# Patient Record
Sex: Female | Born: 1965 | Hispanic: Yes | Marital: Married | Smoking: Never smoker
Health system: Southern US, Community
[De-identification: ages and names within clinical notes are randomized; demographics above are authoritative.]

## PROBLEM LIST (undated history)

## (undated) DIAGNOSIS — E079 Disorder of thyroid, unspecified: Secondary | ICD-10-CM

## (undated) DIAGNOSIS — D219 Benign neoplasm of connective and other soft tissue, unspecified: Secondary | ICD-10-CM

## (undated) DIAGNOSIS — K219 Gastro-esophageal reflux disease without esophagitis: Secondary | ICD-10-CM

## (undated) HISTORY — DX: Disorder of thyroid, unspecified: E07.9

## (undated) HISTORY — DX: Gastro-esophageal reflux disease without esophagitis: K21.9

## (undated) HISTORY — DX: Benign neoplasm of connective and other soft tissue, unspecified: D21.9

---

## 2010-04-08 HISTORY — PX: NASAL POLYP EXCISION: SHX2068

## 2013-04-08 HISTORY — PX: HYSTEROSCOPY: SHX211

## 2015-06-26 ENCOUNTER — Encounter: Payer: Self-pay | Admitting: Obstetrics & Gynecology

## 2017-09-11 MED FILL — ARMOUR THYROID 15 MG TABLET: 15 | 30 days supply | Qty: 30 | Fill #0

## 2017-09-11 MED FILL — ARMOUR THYROID 60 MG TABLET: 60 | 30 days supply | Qty: 30 | Fill #0

## 2017-09-23 ENCOUNTER — Other Ambulatory Visit: Payer: Self-pay | Admitting: Internal Medicine

## 2017-09-23 DIAGNOSIS — Z1231 Encounter for screening mammogram for malignant neoplasm of breast: Secondary | ICD-10-CM

## 2017-11-11 MED FILL — ARMOUR THYROID 15 MG TABLET: 15 | 30 days supply | Qty: 30 | Fill #1

## 2017-11-11 MED FILL — ARMOUR THYROID 60 MG TABLET: 60 | 30 days supply | Qty: 30 | Fill #1

## 2017-12-03 MED FILL — PHENTERMINE 37.5 MG TABLET: 37.5 | 30 days supply | Qty: 30 | Fill #0

## 2017-12-25 MED FILL — ARMOUR THYROID 15 MG TABLET: 15 | 90 days supply | Qty: 90 | Fill #0

## 2017-12-25 MED FILL — ARMOUR THYROID 60 MG TABLET: 60 | 90 days supply | Qty: 90 | Fill #0

## 2017-12-27 ENCOUNTER — Encounter: Payer: Self-pay | Admitting: Internal Medicine

## 2017-12-27 DIAGNOSIS — E038 Other specified hypothyroidism: Secondary | ICD-10-CM

## 2017-12-27 DIAGNOSIS — E039 Hypothyroidism, unspecified: Secondary | ICD-10-CM | POA: Insufficient documentation

## 2017-12-27 DIAGNOSIS — E063 Autoimmune thyroiditis: Principal | ICD-10-CM

## 2018-01-01 ENCOUNTER — Other Ambulatory Visit: Payer: Self-pay | Admitting: Internal Medicine

## 2018-01-01 DIAGNOSIS — R1011 Right upper quadrant pain: Secondary | ICD-10-CM

## 2018-01-02 ENCOUNTER — Telehealth: Payer: Self-pay | Admitting: Obstetrics & Gynecology

## 2018-01-02 NOTE — Telephone Encounter (Signed)
Called and left a message for patient to call back to schedule a new patient doctor referral appointment with our office to see Dr. Sabra Heck for: Perimenopause disorder, uterine fibroids, and pap smear.

## 2018-01-12 ENCOUNTER — Ambulatory Visit
Admission: RE | Admit: 2018-01-12 | Discharge: 2018-01-12 | Disposition: A | Discharge status: Home / Self Care | Payer: No Typology Code available for payment source | Source: Ambulatory Visit | Attending: Internal Medicine | Admitting: Internal Medicine

## 2018-01-12 DIAGNOSIS — R1011 Right upper quadrant pain: Secondary | ICD-10-CM

## 2018-01-12 NOTE — Progress Notes (Signed)
Ultrasound is significant for two small GB polyps. I am not sure how this compares to previous u/s results. Please bring in old studies to scan into your chart. Have you heard about GI appt yet?

## 2018-01-13 ENCOUNTER — Other Ambulatory Visit: Payer: Self-pay | Admitting: Internal Medicine

## 2018-01-13 MED ORDER — PHENTERMINE HCL 37.5 MG PO CAPS: 37.5000 mg | ORAL_CAPSULE | ORAL | 0 refills | Status: DC

## 2018-01-13 MED FILL — PHENTERMINE 37.5 MG TABLET: 37.5 | 30 days supply | Qty: 30 | Fill #0

## 2018-01-21 ENCOUNTER — Encounter: Payer: Self-pay | Admitting: Internal Medicine

## 2018-01-21 ENCOUNTER — Ambulatory Visit (INDEPENDENT_AMBULATORY_CARE_PROVIDER_SITE_OTHER): Payer: No Typology Code available for payment source | Admitting: Internal Medicine

## 2018-01-21 VITALS — BP 116/78 | HR 91 | Temp 98.1°F | Ht 63.0 in | Wt 157.0 lb

## 2018-01-21 DIAGNOSIS — Z6827 Body mass index (BMI) 27.0-27.9, adult: Secondary | ICD-10-CM | POA: Diagnosis not present

## 2018-01-21 DIAGNOSIS — N951 Menopausal and female climacteric states: Secondary | ICD-10-CM

## 2018-01-21 DIAGNOSIS — E663 Overweight: Secondary | ICD-10-CM | POA: Diagnosis not present

## 2018-01-21 DIAGNOSIS — E039 Hypothyroidism, unspecified: Secondary | ICD-10-CM

## 2018-02-02 ENCOUNTER — Other Ambulatory Visit (HOSPITAL_COMMUNITY)
Admission: RE | Admit: 2018-02-02 | Discharge: 2018-02-02 | Disposition: A | Discharge status: Home / Self Care | Payer: No Typology Code available for payment source | Source: Ambulatory Visit | Attending: Obstetrics & Gynecology | Admitting: Obstetrics & Gynecology

## 2018-02-02 ENCOUNTER — Encounter: Payer: Self-pay | Admitting: Obstetrics & Gynecology

## 2018-02-02 ENCOUNTER — Ambulatory Visit (INDEPENDENT_AMBULATORY_CARE_PROVIDER_SITE_OTHER): Payer: No Typology Code available for payment source | Admitting: Obstetrics & Gynecology

## 2018-02-02 ENCOUNTER — Other Ambulatory Visit: Payer: Self-pay

## 2018-02-02 VITALS — BP 96/62 | HR 92 | Resp 16 | Ht 63.0 in | Wt 152.0 lb

## 2018-02-02 DIAGNOSIS — Z01419 Encounter for gynecological examination (general) (routine) without abnormal findings: Secondary | ICD-10-CM | POA: Diagnosis not present

## 2018-02-02 DIAGNOSIS — Z124 Encounter for screening for malignant neoplasm of cervix: Secondary | ICD-10-CM | POA: Diagnosis not present

## 2018-02-02 DIAGNOSIS — D251 Intramural leiomyoma of uterus: Secondary | ICD-10-CM

## 2018-02-02 DIAGNOSIS — D259 Leiomyoma of uterus, unspecified: Secondary | ICD-10-CM | POA: Insufficient documentation

## 2018-02-02 NOTE — Progress Notes (Signed)
Patient scheduled while in office for PUS on 02/19/18 at 12:30pm, consult at 1pm with Dr. Sabra Heck. Patient declined earlier appt offered. Patient is aware she will be called with benefits prior to appt. Patient verbalizes understanding and is agreeable.

## 2018-02-02 NOTE — Progress Notes (Signed)
52 y.o. G57P2002 Married Other or two or more races female here for new patient annual exam.  She was referred from Dr. Glendale Chard.  Reports cycles started to change about a year ago.  She initially skipped 1 months but has skipped up to 90 days.  Her cycles typically lasts about 5-7 days.  She will have three days.  She reports 3 days are heavy.  She uses pads and tampons on the heavy days and she has to change about every 3 hours.  She does sometimes have clots.  She does not have much cramping.  Has experienced significant night sweats over the past.  Did use an OTC tea (with black cohosh) and has used CBD oil.  Typically does not have hot flashes during.    Pap smear is due today.    H/o uterine fibroids.  Last PUS was about 2 years.  7cm fibroid noted then.  She has four other fibroids noted at that time.    Patient's last menstrual period was 12/07/2017 (approximate).          Sexually active: Yes.    The current method of family planning is vasectomy.    Exercising: Yes.    zumba Smoker:  no  Health Maintenance: Pap:  ~3 years ago Normal  History of abnormal Pap:  no MMG:  Age 52 - Normal  Colonoscopy:  2017 Normal. F/u 5 years. Has GI appt 02/05/18. BMD:   Never TDaP:  2017 Pneumonia vaccine(s):  No Shingrix:   No Hep C testing: n/a Screening Labs: PCP   reports that she has never smoked. She has never used smokeless tobacco. She reports that she drinks alcohol. She reports that she has current or past drug history.  Past Medical History:  Diagnosis Date  . Fibroid   . GERD (gastroesophageal reflux disease)   . Thyroid disease     Past Surgical History:  Procedure Laterality Date  . HYSTEROSCOPY  2015    Current Outpatient Medications  Medication Sig Dispense Refill  . Multiple Vitamin (MULTIVITAMIN) tablet Take 1 tablet by mouth daily.    . phentermine 37.5 MG capsule Take 1 capsule (37.5 mg total) by mouth every morning. 30 capsule 0  . thyroid (ARMOUR) 15 MG  tablet Take 15 mg by mouth daily.    Marland Kitchen thyroid (ARMOUR) 60 MG tablet Take 60 mg by mouth daily before breakfast.    . UNABLE TO FIND Take 5 drops by mouth at bedtime. CBD No THC Oil     No current facility-administered medications for this visit.     Family History  Problem Relation Age of Onset  . Colon cancer Mother   . Acromegaly Mother   . Diabetes Father   . Hypertension Father   . Alcohol abuse Father   . Hyperlipidemia Father     Review of Systems  All other systems reviewed and are negative.   Exam:   BP 96/62 (BP Location: Right Arm, Patient Position: Sitting, Cuff Size: Large)   Pulse 92   Resp 16   Ht 5\' 3"  (1.6 m)   Wt 152 lb (68.9 kg)   LMP 12/07/2017 (Approximate)   BMI 26.93 kg/m    Height: 5\' 3"  (160 cm)  Ht Readings from Last 3 Encounters:  02/02/18 5\' 3"  (1.6 m)  01/21/18 5\' 3"  (1.6 m)  12/27/17 5\' 3"  (1.6 m)    General appearance: alert, cooperative and appears stated age Head: Normocephalic, without obvious abnormality, atraumatic Neck: no  adenopathy, supple, symmetrical, trachea midline and thyroid normal to inspection and palpation Lungs: clear to auscultation bilaterally Breasts: normal appearance, no masses or tenderness Heart: regular rate and rhythm Abdomen: soft, non-tender; bowel sounds normal; no masses,  no organomegaly Extremities: extremities normal, atraumatic, no cyanosis or edema Skin: Skin color, texture, turgor normal. No rashes or lesions Lymph nodes: Cervical, supraclavicular, and axillary nodes normal. No abnormal inguinal nodes palpated Neurologic: Grossly normal   Pelvic: External genitalia:  no lesions              Urethra:  normal appearing urethra with no masses, tenderness or lesions              Bartholins and Skenes: normal                 Vagina: normal appearing vagina with normal color and discharge, no lesions              Cervix: no lesions              Pap taken: Yes.   Bimanual Exam:  Uterus:  Enlarged to  10-12 weeks with 5cm feeling nodule on the right.  Enlarged anteriorly as well              Adnexa: normal adnexa and no mass, fullness, tenderness               Rectovaginal: Confirms               Anus:  normal sphincter tone, no lesions  Chaperone was present for exam.  A:  Well Woman with normal exam H/o uterine fibroids and menorrhagia Hypothyroidism Family hx of colon cancer in her mother Perimenopausal by symptoms and cycle changes.  OTC options discussed.    P:   Mammogram guidelines reviewed pap smear with HR HPV obtained today Return for PUS.  Pt not sure she wants any treatment other than taking iron to prevent anemia.  Recommended having iron studies done with next blood work with Dr. Baird Cancer. return annually or prn

## 2018-02-03 ENCOUNTER — Encounter: Payer: Self-pay | Admitting: Obstetrics & Gynecology

## 2018-02-03 NOTE — Progress Notes (Signed)
Hello!  I certainly will! I will forward the results to you once results are available ( I think she uses [facility]).   Thanks,   [employer]

## 2018-02-04 LAB — CYTOLOGY - PAP
Diagnosis: NEGATIVE
HPV: NOT DETECTED

## 2018-02-13 ENCOUNTER — Ambulatory Visit: Payer: No Typology Code available for payment source | Admitting: Obstetrics & Gynecology

## 2018-02-13 ENCOUNTER — Telehealth: Payer: Self-pay | Admitting: Obstetrics & Gynecology

## 2018-02-13 ENCOUNTER — Encounter

## 2018-02-13 NOTE — Telephone Encounter (Signed)
Patient canceled her upcoming PUS and would like to reschedule.

## 2018-02-16 NOTE — Telephone Encounter (Signed)
Spoke with patient in regards to rescheduling an ultrasound appointment. Patient requested to reschedule on 03/26/18 due to work schedule. Patient declined earlier appointment dates. Patient has re-scheduled on 03/26/18 with Dr Sabra Heck. Patient is aware of the appointment date, arrival time and cancellation policy.  Forwarding to Dr Sabra Heck for final review. Patient is agreeable to disposition. Will close encounter

## 2018-02-18 MED FILL — OMEPRAZOLE 40 MG CPDR: 40 | 90 days supply | Qty: 90 | Fill #0

## 2018-02-19 ENCOUNTER — Other Ambulatory Visit: Payer: Self-pay

## 2018-02-19 ENCOUNTER — Other Ambulatory Visit: Payer: Self-pay | Admitting: Obstetrics & Gynecology

## 2018-02-22 NOTE — Progress Notes (Addendum)
Subjective:     Patient ID: Adrienne Joyce , female    DOB: 08-26-1965 , 52 y.o.   MRN: [id number]   Chief Complaint  Patient presents with  . Menopause  . Obesity    HPI  SHE IS HERE TODAY FOR F/U BHRT.  OUR INITIAL GOAL WAS TO ADDRESS THYROID ISSUE PRIOR TO STARTING HRT THERAPY. SHE IS READY TO START HRT. SHE HAS BEEN SEEN BY GYN SINCE HER LAST VISIT.   SECONDLY, SHE HAS BEEN TAKING PHENTERMINE TO HELP HER ACHIEVE IDEAL BODY WEIGHT. SHE HAS NOT HAD ANY ISSUES WITH THE MEDICATION. SHE HAS NOTICED THAT IT DOES SUPPRESS HER APPETITE.     Past Medical History:  Diagnosis Date  . Fibroid   . GERD (gastroesophageal reflux disease)   . Thyroid disease      Family History  Problem Relation Age of Onset  . Colon cancer Mother 86       carcinoid tumor  . Acromegaly Mother   . Diabetes Father   . Hypertension Father   . Alcohol abuse Father   . Hyperlipidemia Father      Current Outpatient Medications:  .  phentermine 37.5 MG capsule, Take 1 capsule (37.5 mg total) by mouth every morning., Disp: 30 capsule, Rfl: 0 .  thyroid (ARMOUR) 15 MG tablet, Take 15 mg by mouth daily., Disp: , Rfl:  .  thyroid (ARMOUR) 60 MG tablet, Take 60 mg by mouth daily before breakfast., Disp: , Rfl:  .  UNABLE TO FIND, Take 5 drops by mouth at bedtime. CBD No THC Oil, Disp: , Rfl:  .  Multiple Vitamin (MULTIVITAMIN) tablet, Take 1 tablet by mouth daily., Disp: , Rfl:    Allergies  Allergen Reactions  . Morphine And Related Shortness Of Breath  . Macrobid [Nitrofurantoin]      Review of Systems  Constitutional: Negative.   Respiratory: Negative.   Cardiovascular: Negative.   Gastrointestinal: Negative.   Neurological: Negative.   Psychiatric/Behavioral: Negative.      Today's Vitals   01/21/18 1446  BP: 116/78  Pulse: 91  Temp: 98.1 F (36.7 C)  TempSrc: Oral  SpO2: 98%  Weight: 157 lb (71.2 kg)  Height: 5\' 3"  (1.6 m)   Body mass index is 27.81 kg/m.   Objective:   Physical Exam  Constitutional: She is oriented to person, place, and time. She appears well-developed and well-nourished.  HENT:  Head: Normocephalic and atraumatic.  Eyes: EOM are normal.  Cardiovascular: Normal rate, regular rhythm and normal heart sounds.  Pulmonary/Chest: Effort normal.  Neurological: She is alert and oriented to person, place, and time.  Psychiatric: She has a normal mood and affect.  Nursing note and vitals reviewed.       Assessment And Plan:     1. Perimenopausal  I WILL START HER ON PROGESTERONE 50MG  NIGHTLY EXCEPT SUNDAYS. SHE HAS DISCUSSED USE OF BHRT WITH HER GYN. SHE WILL RTO IN SIX WEEKS FOR RE-EVALUATION.   2. Primary hypothyroidism  I WILL CHECK A THYROID PANEL AND ADJUST MEDS AS NEEDED. SHE HAS REQUESTED TO HAVE HER LABS SENT TO [facility].   - TSH; Future - T4, Free; Future - T4, Free - TSH  3. Body mass index (bmi) 27.0-27.9, adult  SHE IS CONGRATULATED ON HER WEIGHT LOSS THUS FAR. SHE WAS GIVEN ANOTHER REFILL OF PHENTERMINE. SHE WILL RTO IN 8 WEEKS FOR RE-EVALUATION.    4. Overweight  SHE IS ENCOURAGED TO CONTINUE WITH HER WEIGHT LOSS JOURNEY. SHE IS  CLOSE TO HER GOAL WEIGHT.   Maximino Greenland, MD

## 2018-03-18 ENCOUNTER — Ambulatory Visit
Admission: RE | Admit: 2018-03-18 | Discharge: 2018-03-18 | Disposition: A | Discharge status: Home / Self Care | Payer: No Typology Code available for payment source | Source: Ambulatory Visit | Attending: Internal Medicine | Admitting: Internal Medicine

## 2018-03-18 DIAGNOSIS — Z1231 Encounter for screening mammogram for malignant neoplasm of breast: Secondary | ICD-10-CM

## 2018-03-20 ENCOUNTER — Other Ambulatory Visit: Payer: Self-pay | Admitting: Internal Medicine

## 2018-03-20 DIAGNOSIS — R928 Other abnormal and inconclusive findings on diagnostic imaging of breast: Secondary | ICD-10-CM

## 2018-03-23 ENCOUNTER — Ambulatory Visit
Admission: RE | Admit: 2018-03-23 | Discharge: 2018-03-23 | Disposition: A | Discharge status: Home / Self Care | Payer: No Typology Code available for payment source | Source: Ambulatory Visit | Attending: Internal Medicine | Admitting: Internal Medicine

## 2018-03-23 ENCOUNTER — Other Ambulatory Visit: Payer: Self-pay | Admitting: Internal Medicine

## 2018-03-23 DIAGNOSIS — R928 Other abnormal and inconclusive findings on diagnostic imaging of breast: Secondary | ICD-10-CM

## 2018-03-23 DIAGNOSIS — N6310 Unspecified lump in the right breast, unspecified quadrant: Secondary | ICD-10-CM

## 2018-03-23 DIAGNOSIS — N6320 Unspecified lump in the left breast, unspecified quadrant: Secondary | ICD-10-CM

## 2018-03-24 ENCOUNTER — Other Ambulatory Visit: Payer: Self-pay | Admitting: Internal Medicine

## 2018-03-24 MED ORDER — PHENTERMINE HCL 37.5 MG PO CAPS: 37.5000 mg | ORAL_CAPSULE | ORAL | 0 refills | Status: DC

## 2018-03-26 ENCOUNTER — Ambulatory Visit: Payer: No Typology Code available for payment source | Admitting: Obstetrics & Gynecology

## 2018-03-26 ENCOUNTER — Encounter: Payer: Self-pay | Admitting: Obstetrics & Gynecology

## 2018-03-26 ENCOUNTER — Ambulatory Visit (INDEPENDENT_AMBULATORY_CARE_PROVIDER_SITE_OTHER): Payer: No Typology Code available for payment source

## 2018-03-26 VITALS — BP 108/60 | HR 80 | Resp 16 | Ht 63.0 in | Wt 149.0 lb

## 2018-03-26 DIAGNOSIS — D251 Intramural leiomyoma of uterus: Secondary | ICD-10-CM

## 2018-03-26 NOTE — Progress Notes (Signed)
52 y.o. G6P2002 Married female here for pelvic ultrasound due to enlarged uterus on physical exam.  Pt has been told in the past that she has fibroids.  Thinks the largest was > 5cm.  I do not have documentation of this.  Cycles are less frequent.  Flow is still heavy but feels manageable for her.  She is not interested in any treatment if at all possible.  Patient's last menstrual period was 12/07/2017.  Contraception: vsectomy  Findings:  UTERUS: 8.1 x 5.5 x 4.0cm EMS: 5.100mm ADNEXA: Left ovary:  1.7 x 1.1 x 1.0cm with fibroids 1.7 x 0.8cm, 4.5 x 3.6cm, 1.0 x 1.0cm, and 1.5 x 1.8cm fibroids.  Largest fibroid is right fundal       Right ovary: 2.3 x 1.2 x 2.1cm CUL DE SAC:  No free fluid  Discussion:  Findings reviewed with pt.  She feels the largest fibroid is smaller than in comparison to previous imaging.  Is very satisfied with this.  As above, declines any treatment unless absolutely necessary.  Will continue to follow with physical exam.  She is comfortable with this.  Assessment:  Fibroid uterus  Plan:  Return for AEX in one year.  Will repeat PUS if changes on physical exam or with symptoms.  Questions answered.    ~15 minutes spent with patient >50% of time was in face to face discussion of above.

## 2018-03-27 ENCOUNTER — Ambulatory Visit
Admission: RE | Admit: 2018-03-27 | Discharge: 2018-03-27 | Disposition: A | Discharge status: Home / Self Care | Payer: No Typology Code available for payment source | Source: Ambulatory Visit | Attending: Internal Medicine | Admitting: Internal Medicine

## 2018-03-27 DIAGNOSIS — N6320 Unspecified lump in the left breast, unspecified quadrant: Secondary | ICD-10-CM

## 2018-03-27 DIAGNOSIS — N6310 Unspecified lump in the right breast, unspecified quadrant: Secondary | ICD-10-CM

## 2018-04-07 MED FILL — PHENTERMINE 37.5 MG TABLET: 37.5 | 30 days supply | Qty: 30 | Fill #0

## 2018-04-16 MED FILL — FLOVENT HFA 110 MCG INHALER: 110 | 30 days supply | Qty: 12 | Fill #0

## 2018-04-17 MED FILL — GAVILYTE-G SOLUTION: 236 | 1 days supply | Qty: 4000 | Fill #0

## 2018-04-21 ENCOUNTER — Other Ambulatory Visit: Payer: Self-pay | Admitting: Internal Medicine

## 2018-04-21 MED FILL — ARMOUR THYROID 15 MG TABLET: 15 | 90 days supply | Qty: 90 | Fill #0

## 2018-04-21 MED FILL — ARMOUR THYROID 60 MG TABLET: 60 | 90 days supply | Qty: 90 | Fill #0

## 2018-06-16 ENCOUNTER — Encounter: Payer: Self-pay | Admitting: Internal Medicine

## 2018-07-01 ENCOUNTER — Other Ambulatory Visit: Payer: Self-pay

## 2018-07-01 ENCOUNTER — Other Ambulatory Visit: Payer: Self-pay | Admitting: Internal Medicine

## 2018-07-01 DIAGNOSIS — E663 Overweight: Secondary | ICD-10-CM

## 2018-07-01 DIAGNOSIS — Z6827 Body mass index (BMI) 27.0-27.9, adult: Secondary | ICD-10-CM

## 2018-07-01 MED ORDER — PHENTERMINE HCL 37.5 MG PO CAPS: 37.5000 mg | ORAL_CAPSULE | ORAL | 0 refills | Status: DC

## 2018-08-17 ENCOUNTER — Other Ambulatory Visit: Payer: Self-pay | Admitting: Internal Medicine

## 2018-08-17 DIAGNOSIS — Z6827 Body mass index (BMI) 27.0-27.9, adult: Secondary | ICD-10-CM

## 2018-08-17 DIAGNOSIS — E663 Overweight: Secondary | ICD-10-CM

## 2018-08-17 MED ORDER — PHENTERMINE HCL 37.5 MG PO CAPS: 37.5000 mg | ORAL_CAPSULE | ORAL | 0 refills | Status: DC

## 2018-08-18 ENCOUNTER — Other Ambulatory Visit: Payer: Self-pay | Admitting: Internal Medicine

## 2018-10-21 ENCOUNTER — Encounter: Payer: No Typology Code available for payment source | Admitting: Internal Medicine

## 2018-10-29 ENCOUNTER — Other Ambulatory Visit: Payer: Self-pay

## 2018-10-29 ENCOUNTER — Encounter: Payer: Self-pay | Admitting: Emergency Medicine

## 2018-10-29 ENCOUNTER — Ambulatory Visit
Admission: EM | Admit: 2018-10-29 | Discharge: 2018-10-29 | Disposition: A | Discharge status: Home / Self Care | Payer: No Typology Code available for payment source | Attending: Family Medicine | Admitting: Family Medicine

## 2018-10-29 DIAGNOSIS — W57XXXA Bitten or stung by nonvenomous insect and other nonvenomous arthropods, initial encounter: Secondary | ICD-10-CM

## 2018-10-29 DIAGNOSIS — L03115 Cellulitis of right lower limb: Secondary | ICD-10-CM | POA: Diagnosis not present

## 2018-10-29 MED ORDER — DOXYCYCLINE HYCLATE 100 MG PO CAPS: 100.0000 mg | ORAL_CAPSULE | Freq: Two times a day (BID) | ORAL | 0 refills | Status: DC

## 2018-10-29 MED ORDER — PREDNISONE 10 MG (21) PO TBPK: ORAL_TABLET | ORAL | 0 refills | Status: DC

## 2018-10-29 MED FILL — DOXYCYCLINE HYC 100 MG CAPS: 100 | 7 days supply | Qty: 14 | Fill #0

## 2018-10-29 MED FILL — predniSONE 10 MG TABS: 10 | 6 days supply | Qty: 21 | Fill #0

## 2018-10-29 NOTE — Discharge Instructions (Signed)
Rest.  Elevate.  Medications as prescribed.  Take care  Dr. Lacinda Axon

## 2018-10-29 NOTE — ED Triage Notes (Signed)
Pt c/o right calf pain x3 days, reports sharp pain while mowing lawn 2 nights ago. Redness, swelling and pain have increased. Reports getting 600mg  clindamycin shot last night, but continued pain.

## 2018-10-29 NOTE — ED Provider Notes (Signed)
[facility]    CSN: [id number] Arrival date & time: 10/29/18  0626  History   Chief Complaint Chief Complaint  Patient presents with  . Leg Pain   HPI  53 year old female presents with right leg/calf pain.  Patient reports that approximately 3 days ago she was mowing grass and felt a sudden pinch of pain.  She believes that she was bitten or stung by an insect.  Since that time she has had increasing pain, redness, and warmth to this area.  It is located on her right calf.  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  She was given an injection by her child who is also a [employer] scientist.  Area seems to have improved.  However, it is still red, swollen, and slightly painful.  No fever.  No known exacerbating factors.  No other associated symptoms.  No other complaints.  PMH, Surgical Hx, Family Hx, Social History reviewed and updated as below.  Past Medical History:  Diagnosis Date  . Fibroid   . GERD (gastroesophageal reflux disease)   . Thyroid disease    Patient Active Problem List   Diagnosis Date Noted  . Fibroid uterus 02/02/2018  . Hypothyroidism 12/27/2017   Past Surgical History:  Procedure Laterality Date  . HYSTEROSCOPY  2015  . NASAL POLYP EXCISION  2012   with septoplasty   OB History    Gravida  2   Para  2   Term  2   Preterm      AB      Living  2     SAB      TAB      Ectopic      Multiple      Live Births  2          Home Medications    Prior to Admission medications   Medication Sig Start Date End Date Taking? Authorizing Provider  ARMOUR THYROID 15 MG tablet TAKE 1 TABLET BY MOUTH DAILY 04/21/18  Yes Glendale Chard, MD  ARMOUR THYROID 60 MG tablet TAKE 1 TABLET BY MOUTH DAILY 04/21/18  Yes Glendale Chard, MD  Multiple Vitamin (MULTIVITAMIN) tablet Take 1 tablet by mouth daily.   Yes [provider]  omeprazole (PRILOSEC) 40 MG capsule daily. 02/25/18  Yes [provider]  doxycycline (VIBRAMYCIN) 100  MG capsule Take 1 capsule (100 mg total) by mouth 2 (two) times daily. 10/29/18   Coral Spikes, DO  predniSONE (STERAPRED UNI-PAK 21 TAB) 10 MG (21) TBPK tablet 6 tablets on day 1; decrease by 1 tablet daily until gone. 10/29/18   Emonni Depasquale, Michael Boston G, DO  UNABLE TO FIND Take 5 drops by mouth at bedtime. CBD No THC Oil    [provider]  phentermine (ADIPEX-P) 37.5 MG tablet TAKE 1 TABLET (37.5 MG TOTAL) BY MOUTH EVERY MORNING. 08/18/18 10/29/18  Glendale Chard, MD    Family History Family History  Problem Relation Age of Onset  . Colon cancer Mother 44       carcinoid tumor  . Acromegaly Mother   . Diabetes Father   . Hypertension Father   . Alcohol abuse Father   . Hyperlipidemia Father     Social History Social History   Tobacco Use  . Smoking status: Never Smoker  . Smokeless tobacco: Never Used  Substance Use Topics  . Alcohol use: Yes    Alcohol/week: 0.0 - 1.0 standard drinks  . Drug use: Not Currently  Allergies   Morphine and related and Macrobid [nitrofurantoin]   Review of Systems Review of Systems  Constitutional: Negative.   Skin:       Calf - redness, warmth, tenderness.   Physical Exam Triage Vital Signs ED Triage Vitals  Enc Vitals Group     BP 10/29/18 0834 135/69     Pulse Rate 10/29/18 0834 97     Resp 10/29/18 0834 17     Temp 10/29/18 0834 98.4 F (36.9 C)     Temp Source 10/29/18 0834 Oral     SpO2 10/29/18 0834 100 %     Weight 10/29/18 0835 151 lb (68.5 kg)     Height 10/29/18 0835 5\' 3"  (1.6 m)     Head Circumference --      Peak Flow --      Pain Score 10/29/18 0835 0     Pain Loc --      Pain Edu? --      Excl. in [place]? --    Updated Vital Signs BP 135/69   Pulse 97   Temp 98.4 F (36.9 C) (Oral)   Resp 17   Ht 5\' 3"  (1.6 m)   Wt 68.5 kg   SpO2 100%   BMI 26.75 kg/m   Visual Acuity Right Eye Distance:   Left Eye Distance:   Bilateral Distance:    Right Eye Near:   Left Eye Near:    Bilateral Near:      Physical Exam Vitals signs and nursing note reviewed.  Constitutional:      General: She is not in acute distress.    Appearance: Normal appearance.  HENT:     Head: Normocephalic and atraumatic.  Eyes:     General:        Right eye: No discharge.        Left eye: No discharge.     Conjunctiva/sclera: Conjunctivae normal.  Pulmonary:     Effort: Pulmonary effort is normal. No respiratory distress.  Skin:    Comments: Right calf with a large area of erythema, warmth.  Slight induration.  No fluctuance.  Neurological:     Mental Status: She is alert.  Psychiatric:        Mood and Affect: Mood normal.        Behavior: Behavior normal.    UC Treatments / Results  Labs (all labs ordered are listed, but only abnormal results are displayed) Labs Reviewed - No data to display  EKG   Radiology No results found.  Procedures Procedures (including critical care time)  Medications Ordered in UC Medications - No data to display  Initial Impression / Assessment and Plan / UC Course  I have reviewed the triage vital signs and the nursing notes.  Pertinent labs & imaging results that were available during my care of the patient were reviewed by me and considered in my medical decision making (see chart for details).    53 year old female presents with suspected cellulitis secondary to an insect bite or sting.  I believe that a localized reaction is playing a role as well.  Placed on doxycycline and prednisone.  Final Clinical Impressions(s) / UC Diagnoses   Final diagnoses:  Cellulitis of right lower extremity  Infected insect bite or sting     Discharge Instructions     Rest.  Elevate.  Medications as prescribed.  Take care  Dr. Lacinda Axon    ED Prescriptions    Medication Sig Dispense Auth. Provider  doxycycline (VIBRAMYCIN) 100 MG capsule Take 1 capsule (100 mg total) by mouth 2 (two) times daily. 14 capsule Dylan Ruotolo G, DO   predniSONE (STERAPRED UNI-PAK 21 TAB)  10 MG (21) TBPK tablet 6 tablets on day 1; decrease by 1 tablet daily until gone. 21 tablet Coral Spikes, DO     Controlled Substance Prescriptions [place] Controlled Substance Registry consulted? Not Applicable   Coral Spikes, DO 10/29/18 0901

## 2018-11-12 ENCOUNTER — Encounter: Payer: Self-pay | Admitting: Nurse Practitioner

## 2018-11-12 ENCOUNTER — Ambulatory Visit (INDEPENDENT_AMBULATORY_CARE_PROVIDER_SITE_OTHER): Payer: No Typology Code available for payment source | Admitting: Nurse Practitioner

## 2018-11-12 ENCOUNTER — Other Ambulatory Visit: Payer: Self-pay

## 2018-11-12 DIAGNOSIS — S8011XA Contusion of right lower leg, initial encounter: Secondary | ICD-10-CM | POA: Diagnosis not present

## 2018-11-12 DIAGNOSIS — T07XXXA Unspecified multiple injuries, initial encounter: Secondary | ICD-10-CM

## 2018-11-12 DIAGNOSIS — S8012XA Contusion of left lower leg, initial encounter: Secondary | ICD-10-CM

## 2018-11-12 DIAGNOSIS — S301XXA Contusion of abdominal wall, initial encounter: Secondary | ICD-10-CM

## 2018-11-12 DIAGNOSIS — W2212XA Striking against or struck by front passenger side automobile airbag, initial encounter: Secondary | ICD-10-CM

## 2018-11-12 DIAGNOSIS — R0789 Other chest pain: Secondary | ICD-10-CM

## 2018-11-12 DIAGNOSIS — M542 Cervicalgia: Secondary | ICD-10-CM | POA: Diagnosis not present

## 2018-11-12 DIAGNOSIS — S1091XA Abrasion of unspecified part of neck, initial encounter: Secondary | ICD-10-CM | POA: Diagnosis not present
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
## 2018-11-16 ENCOUNTER — Ambulatory Visit: Payer: Self-pay | Admitting: Nurse Practitioner

## 2018-11-29 DIAGNOSIS — M542 Cervicalgia: Secondary | ICD-10-CM | POA: Insufficient documentation

## 2018-11-29 DIAGNOSIS — R0789 Other chest pain: Secondary | ICD-10-CM | POA: Insufficient documentation

## 2018-11-30 ENCOUNTER — Other Ambulatory Visit: Payer: Self-pay | Admitting: Internal Medicine

## 2018-11-30 ENCOUNTER — Other Ambulatory Visit: Payer: Self-pay

## 2018-11-30 ENCOUNTER — Ambulatory Visit (INDEPENDENT_AMBULATORY_CARE_PROVIDER_SITE_OTHER): Payer: No Typology Code available for payment source | Admitting: Internal Medicine

## 2018-11-30 ENCOUNTER — Encounter: Payer: Self-pay | Admitting: Internal Medicine

## 2018-11-30 VITALS — BP 116/60 | HR 77 | Temp 98.6°F | Ht 62.4 in | Wt 154.4 lb

## 2018-11-30 DIAGNOSIS — Z0000 Encounter for general adult medical examination without abnormal findings: Secondary | ICD-10-CM | POA: Diagnosis not present

## 2018-11-30 DIAGNOSIS — E663 Overweight: Secondary | ICD-10-CM | POA: Diagnosis not present

## 2018-11-30 DIAGNOSIS — N951 Menopausal and female climacteric states: Secondary | ICD-10-CM | POA: Diagnosis not present

## 2018-11-30 DIAGNOSIS — K529 Noninfective gastroenteritis and colitis, unspecified: Secondary | ICD-10-CM

## 2018-11-30 DIAGNOSIS — E039 Hypothyroidism, unspecified: Secondary | ICD-10-CM

## 2018-11-30 DIAGNOSIS — Z712 Person consulting for explanation of examination or test findings: Secondary | ICD-10-CM

## 2018-11-30 MED ORDER — PHENTERMINE HCL 37.5 MG PO TABS: 37.5000 mg | ORAL_TABLET | Freq: Every day | ORAL | 1 refills | Status: DC

## 2018-11-30 MED FILL — PHENTERMINE 37.5 MG TABLET: 37.5 | 30 days supply | Qty: 30 | Fill #0

## 2018-11-30 MED FILL — ARMOUR THYROID 60 MG TABLET: 60 | 90 days supply | Qty: 90 | Fill #0

## 2018-11-30 MED FILL — ARMOUR THYROID 15 MG TABLET: 15 | 90 days supply | Qty: 90 | Fill #0

## 2018-11-30 NOTE — Patient Instructions (Signed)
Health Maintenance, Female Adopting a healthy lifestyle and getting preventive care are important in promoting health and wellness. Ask your health care provider about:  The right schedule for you to have regular tests and exams.  Things you can do on your own to prevent diseases and keep yourself healthy. What should I know about diet, weight, and exercise? Eat a healthy diet   Eat a diet that includes plenty of vegetables, fruits, low-fat dairy products, and lean protein.  Do not eat a lot of foods that are high in solid fats, added sugars, or sodium. Maintain a healthy weight Body mass index (BMI) is used to identify weight problems. It estimates body fat based on height and weight. Your health care provider can help determine your BMI and help you achieve or maintain a healthy weight. Get regular exercise Get regular exercise. This is one of the most important things you can do for your health. Most adults should:  Exercise for at least 150 minutes each week. The exercise should increase your heart rate and make you sweat (moderate-intensity exercise).  Do strengthening exercises at least twice a week. This is in addition to the moderate-intensity exercise.  Spend less time sitting. Even light physical activity can be beneficial. Watch cholesterol and blood lipids Have your blood tested for lipids and cholesterol at 53 years of age, then have this test every 5 years. Have your cholesterol levels checked more often if:  Your lipid or cholesterol levels are high.  You are older than 53 years of age.  You are at high risk for heart disease. What should I know about cancer screening? Depending on your health history and family history, you may need to have cancer screening at various ages. This may include screening for:  Breast cancer.  Cervical cancer.  Colorectal cancer.  Skin cancer.  Lung cancer. What should I know about heart disease, diabetes, and high blood  pressure? Blood pressure and heart disease  High blood pressure causes heart disease and increases the risk of stroke. This is more likely to develop in people who have high blood pressure readings, are of African descent, or are overweight.  Have your blood pressure checked: ? Every 3-5 years if you are 18-39 years of age. ? Every year if you are 40 years old or older. Diabetes Have regular diabetes screenings. This checks your fasting blood sugar level. Have the screening done:  Once every three years after age 40 if you are at a normal weight and have a low risk for diabetes.  More often and at a younger age if you are overweight or have a high risk for diabetes. What should I know about preventing infection? Hepatitis B If you have a higher risk for hepatitis B, you should be screened for this virus. Talk with your health care provider to find out if you are at risk for hepatitis B infection. Hepatitis C Testing is recommended for:  Everyone born from 1945 through 1965.  Anyone with known risk factors for hepatitis C. Sexually transmitted infections (STIs)  Get screened for STIs, including gonorrhea and chlamydia, if: ? You are sexually active and are younger than 53 years of age. ? You are older than 53 years of age and your health care provider tells you that you are at risk for this type of infection. ? Your sexual activity has changed since you were last screened, and you are at increased risk for chlamydia or gonorrhea. Ask your health care provider if   you are at risk.  Ask your health care provider about whether you are at high risk for HIV. Your health care provider may recommend a prescription medicine to help prevent HIV infection. If you choose to take medicine to prevent HIV, you should first get tested for HIV. You should then be tested every 3 months for as long as you are taking the medicine. Pregnancy  If you are about to stop having your period (premenopausal) and  you may become pregnant, seek counseling before you get pregnant.  Take 400 to 800 micrograms (mcg) of folic acid every day if you become pregnant.  Ask for birth control (contraception) if you want to prevent pregnancy. Osteoporosis and menopause Osteoporosis is a disease in which the bones lose minerals and strength with aging. This can result in bone fractures. If you are 65 years old or older, or if you are at risk for osteoporosis and fractures, ask your health care provider if you should:  Be screened for bone loss.  Take a calcium or vitamin D supplement to lower your risk of fractures.  Be given hormone replacement therapy (HRT) to treat symptoms of menopause. Follow these instructions at home: Lifestyle  Do not use any products that contain nicotine or tobacco, such as cigarettes, e-cigarettes, and chewing tobacco. If you need help quitting, ask your health care provider.  Do not use street drugs.  Do not share needles.  Ask your health care provider for help if you need support or information about quitting drugs. Alcohol use  Do not drink alcohol if: ? Your health care provider tells you not to drink. ? You are pregnant, may be pregnant, or are planning to become pregnant.  If you drink alcohol: ? Limit how much you use to 0-1 drink a day. ? Limit intake if you are breastfeeding.  Be aware of how much alcohol is in your drink. In the [place] one drink equals one 12 oz bottle of beer (355 mL), one 5 oz glass of wine (148 mL), or one 1 oz glass of hard liquor (44 mL). General instructions  Schedule regular health, dental, and eye exams.  Stay current with your vaccines.  Tell your health care provider if: ? You often feel depressed. ? You have ever been abused or do not feel safe at home. Summary  Adopting a healthy lifestyle and getting preventive care are important in promoting health and wellness.  Follow your health care provider's instructions about healthy  diet, exercising, and getting tested or screened for diseases.  Follow your health care provider's instructions on monitoring your cholesterol and blood pressure. This information is not intended to replace advice given to you by your health care provider. Make sure you discuss any questions you have with your health care provider. Document Released: 10/08/2010 Document Revised: 03/18/2018 Document Reviewed: 03/18/2018 Elsevier Patient Education  2020 Elsevier Inc.  

## 2018-12-01 LAB — POCT URINALYSIS DIPSTICK
Bilirubin, UA: NEGATIVE
Blood, UA: NEGATIVE
Glucose, UA: NEGATIVE
Ketones, UA: NEGATIVE
Leukocytes, UA: NEGATIVE
Nitrite, UA: NEGATIVE
Protein, UA: NEGATIVE
Spec Grav, UA: 1.02 (ref 1.010–1.025)
Urobilinogen, UA: 0.2 E.U./dL
pH, UA: 5 (ref 5.0–8.0)

## 2018-12-01 NOTE — Progress Notes (Signed)
Subjective:     Patient ID: Adrienne Joyce , female    DOB: [birth year] , 53 y.o.   MRN: [id number]   Chief Complaint  Patient presents with  . Annual Exam    HPI  She is here today for a full physical examination. She is followed by GYN for her pelvic examinations. She reports she is up to date with both pap and mammogram.     Past Medical History:  Diagnosis Date  . Fibroid   . GERD (gastroesophageal reflux disease)   . Thyroid disease      Family History  Problem Relation Age of Onset  . Colon cancer Mother 58       carcinoid tumor  . Acromegaly Mother   . Diabetes Father   . Hypertension Father   . Alcohol abuse Father   . Hyperlipidemia Father      Current Outpatient Medications:  Marland Kitchen  Multiple Vitamin (MULTIVITAMIN) tablet, Take 1 tablet by mouth daily., Disp: , Rfl:  .  omeprazole (PRILOSEC) 40 MG capsule, daily., Disp: , Rfl:  .  ARMOUR THYROID 15 MG tablet, TAKE 1 TABLET BY MOUTH DAILY, Disp: 90 tablet, Rfl: 0 .  ARMOUR THYROID 60 MG tablet, TAKE 1 TABLET BY MOUTH DAILY, Disp: 90 tablet, Rfl: 0 .  doxycycline (VIBRAMYCIN) 100 MG capsule, Take 1 capsule (100 mg total) by mouth 2 (two) times daily. (Patient not taking: Reported on 11/30/2018), Disp: 14 capsule, Rfl: 0 .  phentermine (ADIPEX-P) 37.5 MG tablet, Take 1 tablet (37.5 mg total) by mouth daily before breakfast., Disp: 30 tablet, Rfl: 1 .  predniSONE (STERAPRED UNI-PAK 21 TAB) 10 MG (21) TBPK tablet, 6 tablets on day 1; decrease by 1 tablet daily until gone. (Patient not taking: Reported on 11/30/2018), Disp: 21 tablet, Rfl: 0 .  UNABLE TO FIND, Take 5 drops by mouth at bedtime. CBD No THC Oil, Disp: , Rfl:    Allergies  Allergen Reactions  . Morphine And Related Shortness Of Breath  . Macrobid [Nitrofurantoin]      The patient states she uses none for birth control. Last LMP was No LMP recorded. (Menstrual status: Perimenopausal).. Negative for Dysmenorrhea @MAMMOFINDINGS @. Negative for: breast  discharge, breast lump(s), breast pain and breast self exam. Associated symptoms include abnormal vaginal bleeding. Pertinent negatives include abnormal bleeding (hematology), anxiety, decreased libido, depression, difficulty falling sleep, dyspareunia, history of infertility, nocturia, sexual dysfunction, sleep disturbances, urinary incontinence, urinary urgency, vaginal discharge and vaginal itching. Diet regular.The patient states her exercise level is  regular/moderate.   . The patient's tobacco use is:  Social History   Tobacco Use  Smoking Status Never Smoker  Smokeless Tobacco Never Used  . She has been exposed to passive smoke. The patient's alcohol use is:  Social History   Substance and Sexual Activity  Alcohol Use Yes  . Alcohol/week: 0.0 - 1.0 standard drinks   Comment: occasionally  . Additional information: Last pap 2019, next one scheduled for 2021.   Review of Systems  Constitutional: Negative.   HENT: Negative.   Eyes: Negative.   Respiratory: Negative.   Cardiovascular: Negative.   Endocrine: Negative.   Genitourinary: Negative.        She c/o vaginal dryness. She would like to resume BHRT. She was previously taking progesterone nightly. She would like to resume this therapy.   Musculoskeletal: Negative.   Skin: Negative.   Allergic/Immunologic: Negative.   Neurological: Negative.   Hematological: Negative.   Psychiatric/Behavioral: Negative.  Today's Vitals   11/30/18 1504  BP: 116/60  Pulse: 77  Temp: 98.6 F (37 C)  TempSrc: Oral  SpO2: 98%  Weight: 154 lb 6.4 oz (70 kg)  Height: 5' 2.4" (1.585 m)   Body mass index is 27.88 kg/m.   Objective:  Physical Exam Vitals signs and nursing note reviewed.  Constitutional:      Appearance: Normal appearance.  HENT:     Head: Normocephalic and atraumatic.     Right Ear: Tympanic membrane, ear canal and external ear normal.     Left Ear: Tympanic membrane, ear canal and external ear normal.      Nose: Nose normal.     Mouth/Throat:     Mouth: Mucous membranes are moist.     Pharynx: Oropharynx is clear.  Eyes:     Extraocular Movements: Extraocular movements intact.     Conjunctiva/sclera: Conjunctivae normal.     Pupils: Pupils are equal, round, and reactive to light.  Neck:     Musculoskeletal: Normal range of motion and neck supple.  Cardiovascular:     Rate and Rhythm: Normal rate and regular rhythm.     Pulses: Normal pulses.     Heart sounds: Normal heart sounds.  Pulmonary:     Effort: Pulmonary effort is normal.     Breath sounds: Normal breath sounds.  Chest:     Comments: She wore bra during exam. Breast exam not performed, she states this was done by her GYN.  Abdominal:     General: Abdomen is flat. Bowel sounds are normal.     Palpations: Abdomen is soft.  Genitourinary:    Comments: deferred Musculoskeletal: Normal range of motion.  Skin:    General: Skin is warm and dry.  Neurological:     General: No focal deficit present.     Mental Status: She is alert and oriented to person, place, and time.  Psychiatric:        Mood and Affect: Mood normal.        Behavior: Behavior normal.         Assessment And Plan:     1. Routine general medical examination at health care facility  A full exam was performed.  Importance of monthly self breast exams was discussed with the patient.  PATIENT HAS BEEN ADVISED TO GET 30-45 MINUTES REGULAR EXERCISE NO LESS THAN FOUR TO FIVE DAYS PER WEEK - BOTH WEIGHTBEARING EXERCISES AND AEROBIC ARE RECOMMENDED.  SHE WAS ADVISED TO FOLLOW A HEALTHY DIET WITH AT LEAST SIX FRUITS/VEGGIES PER DAY, DECREASE INTAKE OF RED MEAT, AND TO INCREASE FISH INTAKE TO TWO DAYS PER WEEK.  MEATS/FISH SHOULD NOT BE FRIED, BAKED OR BROILED IS PREFERABLE.  I SUGGEST WEARING SPF 50 SUNSCREEN ON EXPOSED PARTS AND ESPECIALLY WHEN IN THE DIRECT SUNLIGHT FOR AN EXTENDED PERIOD OF TIME.  PLEASE AVOID FAST FOOD RESTAURANTS AND INCREASE YOUR WATER  INTAKE.  - Lipid panel - Hemoglobin A1c - TSH - T4, Free - T3, free - Thyroglobulin antibody - Thyroid Peroxidase Antibody - HIV antibody (with reflex) - POCT Urinalysis Dipstick (81002)  2. Colitis  ER records reviewed in full detail. She is encouraged to resume probiotic use. She does have f/u with GI in the next day or two.   3. Primary hypothyroidism  I will check full thyroid panel at her request. I will also check thyroid antibodies as requested.   4. Perimenopausal  I will check [place]. I will also resume progesterone 50mg  nightly except Sundays.  She will f/u in 3 months for re-evaluation.   5. Overweight (BMI 25.0-29.9)  BMI 27.  She would like to take medication to help her achieve BMI 24 or less.  She reports this will decrease her risk of breast cancer. She was given rx phentermine 37.5mg  - `1/2 tablet x 7 days, then one tablet daily. She agrees to come in for monthly weigh-ins. She will f/u with me in 12 weeks. At that time, we will consider another agent if needed.   Maximino Greenland, MD    THE PATIENT IS ENCOURAGED TO PRACTICE SOCIAL DISTANCING DUE TO THE COVID-19 PANDEMIC.

## 2018-12-04 LAB — HEMOGLOBIN A1C
Est. average glucose Bld gHb Est-mCnc: 103 mg/dL
Hgb A1c MFr Bld: 5.2 % (ref 4.8–5.6)

## 2018-12-04 LAB — LIPID PANEL
Chol/HDL Ratio: 2.8 ratio (ref 0.0–4.4)
Cholesterol, Total: 197 mg/dL (ref 100–199)
HDL: 71 mg/dL (ref >39)
LDL Calculated: 103 mg/dL — ABNORMAL HIGH (ref 0–99)
Triglycerides: 115 mg/dL (ref 0–149)
VLDL Cholesterol Cal: 23 mg/dL (ref 5–40)

## 2018-12-04 LAB — T3, FREE: T3, Free: 2.9 pg/mL (ref 2.0–4.4)

## 2018-12-04 LAB — TSH: TSH: 0.88 u[IU]/mL (ref 0.450–4.500)

## 2018-12-04 LAB — HIV ANTIBODY (ROUTINE TESTING W REFLEX): HIV Screen 4th Generation wRfx: NONREACTIVE

## 2018-12-04 LAB — T4, FREE: Free T4: 0.83 ng/dL (ref 0.82–1.77)

## 2018-12-04 LAB — THYROGLOBULIN ANTIBODY: Thyroglobulin Antibody: <1 IU/mL (ref 0.0–0.9)

## 2018-12-04 LAB — THYROID PEROXIDASE ANTIBODY: Thyroperoxidase Ab SerPl-aCnc: 19 IU/mL (ref 0–34)

## 2018-12-08 ENCOUNTER — Encounter: Payer: Self-pay | Admitting: Internal Medicine

## 2018-12-10 MED FILL — ARMOUR THYROID 60 MG TABLET: 60 | 90 days supply | Qty: 90 | Fill #0

## 2018-12-10 MED FILL — ARMOUR THYROID 15 MG TABLET: 15 | 90 days supply | Qty: 90 | Fill #0

## 2018-12-31 ENCOUNTER — Other Ambulatory Visit: Payer: Self-pay

## 2018-12-31 ENCOUNTER — Ambulatory Visit: Payer: No Typology Code available for payment source

## 2018-12-31 VITALS — BP 118/72 | HR 86 | Temp 98.2°F | Ht 62.4 in | Wt 145.4 lb

## 2018-12-31 DIAGNOSIS — E663 Overweight: Secondary | ICD-10-CM

## 2018-12-31 NOTE — Progress Notes (Signed)
Pt presents today for weight check.

## 2019-01-14 ENCOUNTER — Ambulatory Visit: Payer: No Typology Code available for payment source

## 2019-01-28 ENCOUNTER — Other Ambulatory Visit: Payer: Self-pay

## 2019-01-28 ENCOUNTER — Ambulatory Visit: Payer: No Typology Code available for payment source

## 2019-01-28 VITALS — BP 124/86 | HR 101 | Temp 98.2°F | Ht 62.4 in | Wt 143.8 lb

## 2019-01-28 DIAGNOSIS — E663 Overweight: Secondary | ICD-10-CM

## 2019-02-04 IMAGING — MG DIGITAL SCREENING BILATERAL MAMMOGRAM WITH TOMO AND CAD
6 of 10 series · 6 of 30 positions shown · non-contrast
Comparison: No prior films

CLINICAL DATA: Screening.

EXAM:
DIGITAL SCREENING BILATERAL MAMMOGRAM WITH TOMO AND CAD

[L CC synth-2D]
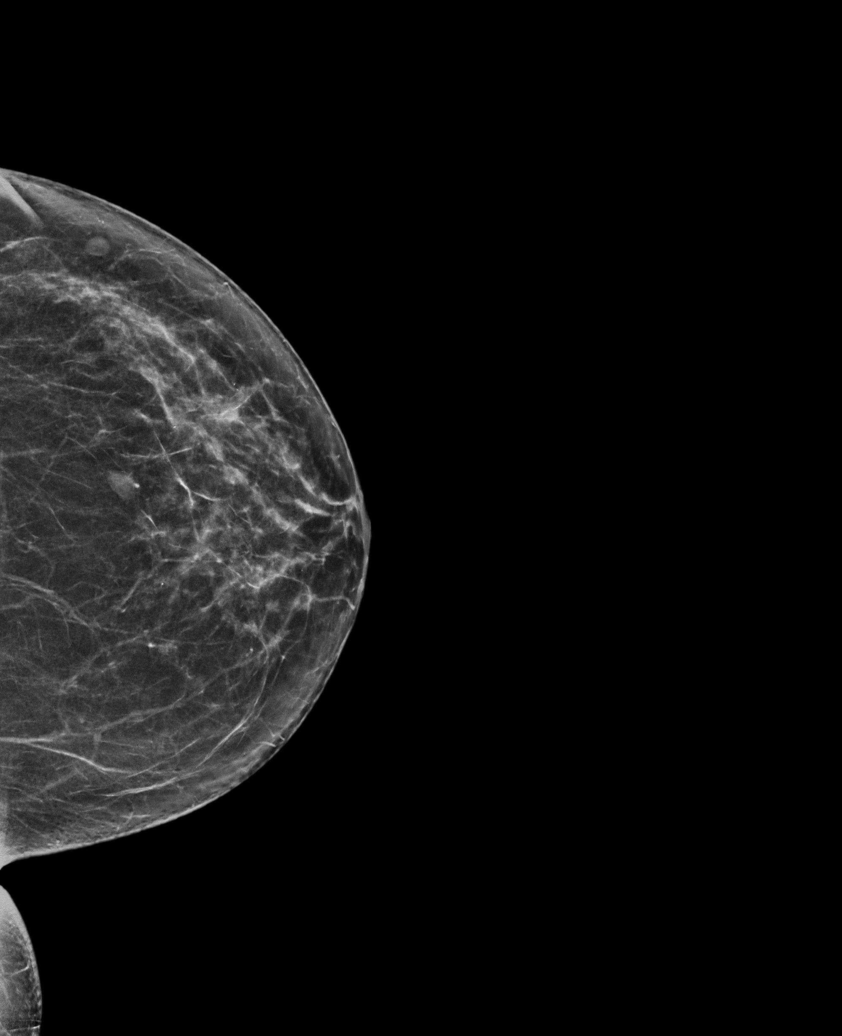

[L MLO synth-2D (1 of 2)]
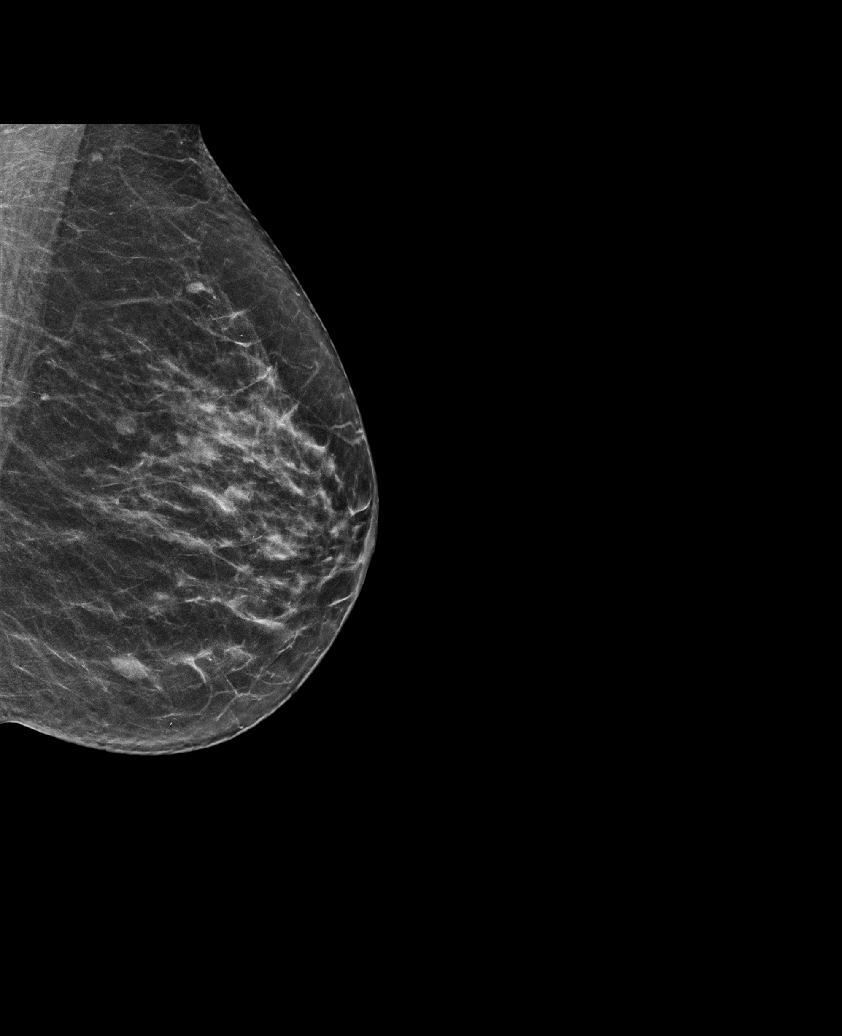

[R MLO synth-2D]
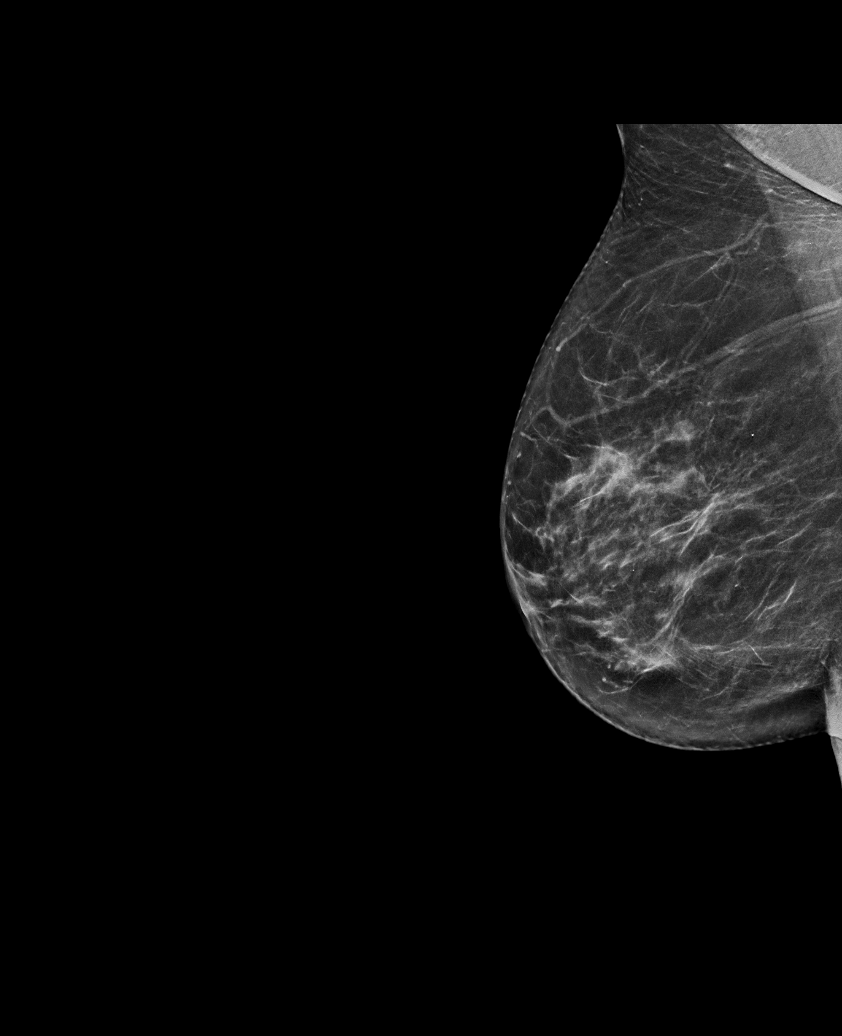

[R CC synth-2D]
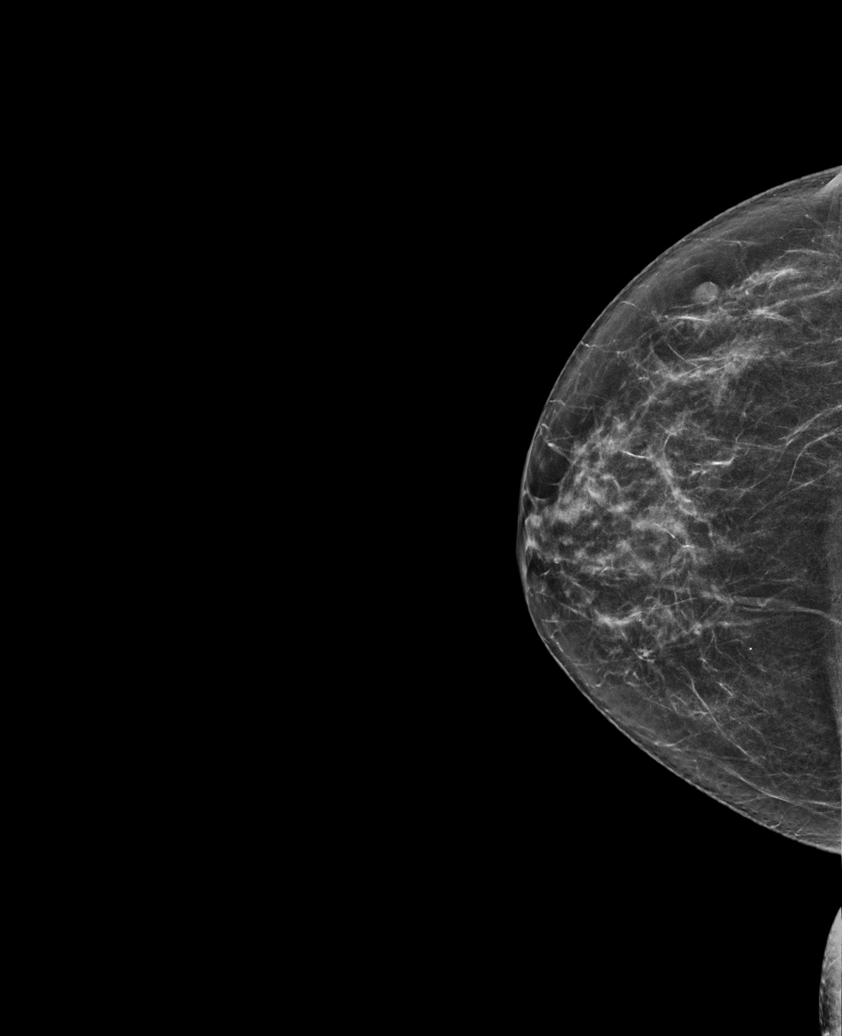

[L MLO synth-2D (2 of 2)]
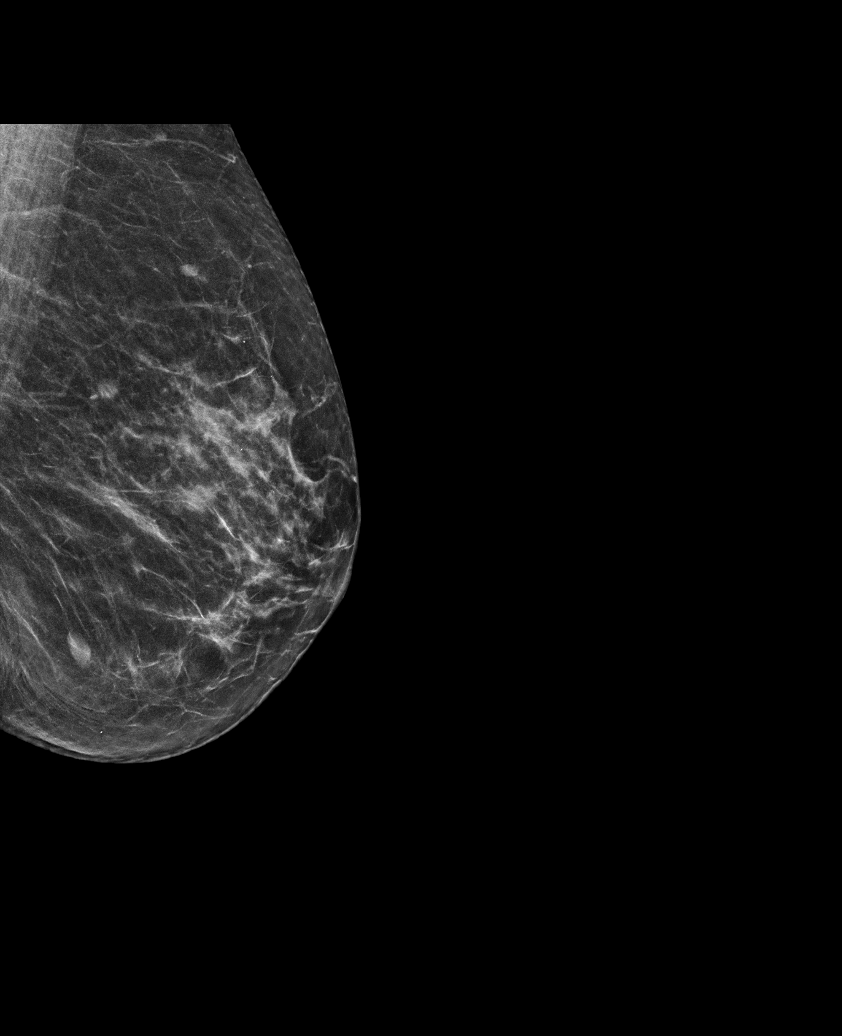

[L CC tomo · tomo slice 31/60.0]
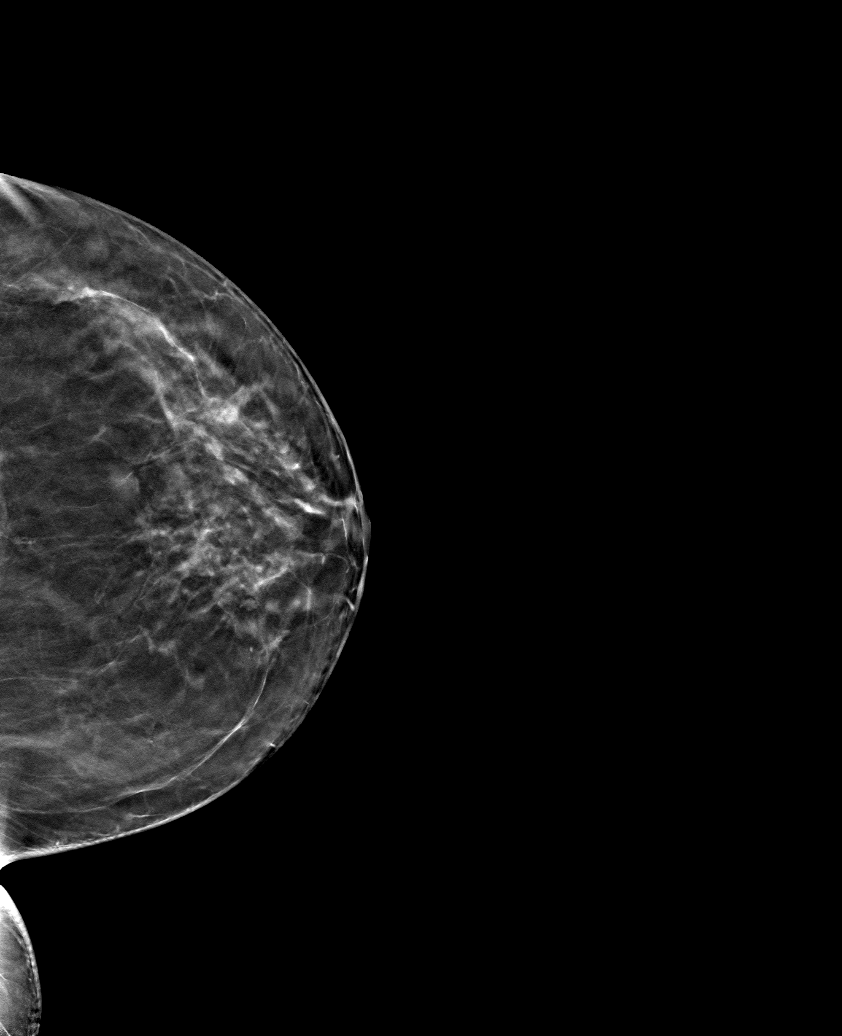

[6 of 30 positions shown; findings below may reference images not displayed]

ACR Breast Density Category b: There are scattered areas of
fibroglandular density.
FINDINGS: In the right breast mass requires further evaluation.

In the left breast masses requires further evaluation.

Images were processed with CAD.
IMPRESSION: Further evaluation is suggested for possible mass in the right
breast.

Further evaluation is suggested for possible masses in the left
breast.

RECOMMENDATION:
ultrasound of both breasts. (Code:24-5-EEC)

The patient will be contacted regarding the findings, and additional
imaging will be scheduled.

BI-RADS CATEGORY  0: Incomplete. Need additional imaging evaluation
and/or prior mammograms for comparison.

## 2019-02-11 ENCOUNTER — Other Ambulatory Visit: Payer: Self-pay | Admitting: Internal Medicine

## 2019-02-11 MED ORDER — THYROID 60 MG PO TABS: 60.0000 mg | ORAL_TABLET | Freq: Every day | ORAL | 0 refills | Status: DC

## 2019-02-11 MED ORDER — THYROID 15 MG PO TABS: 15.0000 mg | ORAL_TABLET | Freq: Every day | ORAL | 0 refills | Status: DC

## 2019-02-11 NOTE — Telephone Encounter (Signed)
She has in the message that she needs phentermine refilled too.

## 2019-02-18 ENCOUNTER — Other Ambulatory Visit: Payer: Self-pay | Admitting: Internal Medicine

## 2019-02-18 MED ORDER — PHENTERMINE HCL 37.5 MG PO TABS: 37.5000 mg | ORAL_TABLET | Freq: Every day | ORAL | 1 refills | Status: DC

## 2019-02-19 MED FILL — PHENTERMINE 37.5 MG TABLET: 37.5 | 30 days supply | Qty: 30 | Fill #0

## 2019-03-08 ENCOUNTER — Telehealth: Payer: Self-pay

## 2019-03-08 ENCOUNTER — Ambulatory Visit: Payer: No Typology Code available for payment source | Admitting: Internal Medicine

## 2019-03-08 NOTE — Telephone Encounter (Signed)
The patient left a message to cancel her appointment because her daughter isn't feeling well and that her daughter is going to get tested for the coronavirus.

## 2019-03-14 ENCOUNTER — Telehealth: Payer: No Typology Code available for payment source | Admitting: Family

## 2019-03-14 DIAGNOSIS — R399 Unspecified symptoms and signs involving the genitourinary system: Secondary | ICD-10-CM

## 2019-03-14 MED ORDER — CEPHALEXIN 500 MG PO CAPS: 500.0000 mg | ORAL_CAPSULE | Freq: Two times a day (BID) | ORAL | 0 refills | Status: DC

## 2019-03-14 NOTE — Progress Notes (Signed)

## 2019-03-25 ENCOUNTER — Encounter: Payer: Self-pay | Admitting: Internal Medicine

## 2019-03-29 ENCOUNTER — Other Ambulatory Visit: Payer: Self-pay | Admitting: Internal Medicine

## 2019-03-29 DIAGNOSIS — K824 Cholesterolosis of gallbladder: Secondary | ICD-10-CM

## 2019-04-05 ENCOUNTER — Encounter: Payer: Self-pay | Admitting: Internal Medicine

## 2019-04-20 ENCOUNTER — Ambulatory Visit
Admission: RE | Admit: 2019-04-20 | Discharge: 2019-04-20 | Disposition: A | Discharge status: Home / Self Care | Payer: No Typology Code available for payment source | Source: Ambulatory Visit | Attending: Internal Medicine | Admitting: Internal Medicine

## 2019-04-20 ENCOUNTER — Other Ambulatory Visit: Payer: No Typology Code available for payment source

## 2019-04-20 DIAGNOSIS — K824 Cholesterolosis of gallbladder: Secondary | ICD-10-CM

## 2019-04-22 ENCOUNTER — Encounter: Payer: Self-pay | Admitting: Internal Medicine

## 2019-04-26 ENCOUNTER — Encounter: Payer: Self-pay | Admitting: Internal Medicine

## 2019-04-30 MED FILL — ARMOUR THYROID 15 MG TABLET: 15 | 90 days supply | Qty: 90 | Fill #0

## 2019-04-30 MED FILL — ARMOUR THYROID 60 MG TABLET: 60 | 90 days supply | Qty: 90 | Fill #0

## 2019-06-11 ENCOUNTER — Ambulatory Visit: Payer: No Typology Code available for payment source | Admitting: Obstetrics & Gynecology

## 2019-07-19 ENCOUNTER — Ambulatory Visit: Payer: No Typology Code available for payment source | Admitting: Obstetrics & Gynecology

## 2019-07-26 ENCOUNTER — Ambulatory Visit: Payer: No Typology Code available for payment source | Admitting: Obstetrics & Gynecology

## 2019-08-10 ENCOUNTER — Other Ambulatory Visit: Payer: Self-pay | Admitting: Internal Medicine

## 2019-08-12 ENCOUNTER — Other Ambulatory Visit: Payer: Self-pay

## 2019-08-12 ENCOUNTER — Other Ambulatory Visit: Payer: No Typology Code available for payment source

## 2019-08-12 DIAGNOSIS — E038 Other specified hypothyroidism: Secondary | ICD-10-CM

## 2019-08-12 DIAGNOSIS — E063 Autoimmune thyroiditis: Secondary | ICD-10-CM

## 2019-08-13 LAB — TSH: TSH: 2.02 u[IU]/mL (ref 0.450–4.500)

## 2019-08-13 LAB — T4, FREE: Free T4: 0.91 ng/dL (ref 0.82–1.77)

## 2019-08-13 LAB — T3, FREE: T3, Free: 2 pg/mL (ref 2.0–4.4)

## 2019-08-13 NOTE — Progress Notes (Deleted)
54 y.o. [id number] Married Other or two or more races female here for annual exam.    No LMP recorded. (Menstrual status: Perimenopausal).          Sexually active: {yes no:314532}  The current method of family planning is vasectomy.    Exercising: {yes no:314532}  {types:19826} Smoker:  {YES NO:22349}  Health Maintenance: Pap:  02-02-18 neg HPV HR neg History of abnormal Pap:  no MMG: 04-03-18 bilateral & u/s birads 4: suspicious (see reports) Colonoscopy:  2017 normal, f/u 71yrs BMD:   no TDaP:  2017 Pneumonia vaccine(s):  no Shingrix:   no Hep C testing: *** Screening Labs: ***   reports that she has never smoked. She has never used smokeless tobacco. She reports current alcohol use. She reports previous drug use.  Past Medical History:  Diagnosis Date  . Fibroid   . GERD (gastroesophageal reflux disease)   . Thyroid disease     Past Surgical History:  Procedure Laterality Date  . HYSTEROSCOPY  2015  . NASAL POLYP EXCISION  2012   with septoplasty    Current Outpatient Medications  Medication Sig Dispense Refill  . cephALEXin (KEFLEX) 500 MG capsule Take 1 capsule (500 mg total) by mouth 2 (two) times daily. 14 capsule 0  . Multiple Vitamin (MULTIVITAMIN) tablet Take 1 tablet by mouth daily.    Marland Kitchen omeprazole (PRILOSEC) 40 MG capsule daily.    . phentermine (ADIPEX-P) 37.5 MG tablet Take 1 tablet (37.5 mg total) by mouth daily before breakfast. 30 tablet 1  . thyroid (ARMOUR THYROID) 15 MG tablet Take 1 tablet (15 mg total) by mouth daily. 90 tablet 0  . thyroid (ARMOUR THYROID) 60 MG tablet Take 1 tablet (60 mg total) by mouth daily. 90 tablet 0  . UNABLE TO FIND Take 5 drops by mouth at bedtime. CBD No THC Oil     No current facility-administered medications for this visit.    Family History  Problem Relation Age of Onset  . Colon cancer Mother 15       carcinoid tumor  . Acromegaly Mother   . Diabetes Father   . Hypertension Father   . Alcohol abuse Father   .  Hyperlipidemia Father     Review of Systems  Exam:   There were no vitals taken for this visit.     General appearance: alert, cooperative and appears stated age Head: Normocephalic, without obvious abnormality, atraumatic Neck: no adenopathy, supple, symmetrical, trachea midline and thyroid {EXAM; THYROID:18604} Lungs: clear to auscultation bilaterally Breasts: {Exam; breast:13139::"normal appearance, no masses or tenderness"} Heart: regular rate and rhythm Abdomen: soft, non-tender; bowel sounds normal; no masses,  no organomegaly Extremities: extremities normal, atraumatic, no cyanosis or edema Skin: Skin color, texture, turgor normal. No rashes or lesions Lymph nodes: Cervical, supraclavicular, and axillary nodes normal. No abnormal inguinal nodes palpated Neurologic: Grossly normal   Pelvic: External genitalia:  no lesions              Urethra:  normal appearing urethra with no masses, tenderness or lesions              Bartholins and Skenes: normal                 Vagina: normal appearing vagina with normal color and discharge, no lesions              Cervix: {exam; cervix:14595}              Pap taken: {yes  NH:[id number] Bimanual Exam:  Uterus:  {exam; uterus:12215}              Adnexa: {exam; adnexa:12223}               Rectovaginal: Confirms               Anus:  normal sphincter tone, no lesions  Chaperone, ***Terence Lux, CMA, was present for exam.  A:  Well Woman with normal exam  P:   {plan; gyn:5269::"mammogram","pap smear","return annually or prn"}

## 2019-08-16 ENCOUNTER — Encounter: Payer: Self-pay | Admitting: Internal Medicine

## 2019-08-18 ENCOUNTER — Other Ambulatory Visit: Payer: Self-pay

## 2019-08-18 ENCOUNTER — Encounter: Payer: Self-pay | Admitting: Internal Medicine

## 2019-08-18 ENCOUNTER — Other Ambulatory Visit: Payer: Self-pay | Admitting: Internal Medicine

## 2019-08-18 ENCOUNTER — Ambulatory Visit (INDEPENDENT_AMBULATORY_CARE_PROVIDER_SITE_OTHER): Payer: No Typology Code available for payment source | Admitting: Internal Medicine

## 2019-08-18 VITALS — BP 98/58 | HR 80 | Temp 98.0°F | Wt 151.0 lb

## 2019-08-18 DIAGNOSIS — E039 Hypothyroidism, unspecified: Secondary | ICD-10-CM

## 2019-08-18 DIAGNOSIS — N950 Postmenopausal bleeding: Secondary | ICD-10-CM | POA: Diagnosis not present

## 2019-08-18 DIAGNOSIS — N951 Menopausal and female climacteric states: Secondary | ICD-10-CM | POA: Diagnosis not present

## 2019-08-18 MED ORDER — THYROID 30 MG PO TABS: 30.0000 mg | ORAL_TABLET | Freq: Every day | ORAL | 1 refills | Status: DC

## 2019-08-18 MED ORDER — THYROID 60 MG PO TABS: 60.0000 mg | ORAL_TABLET | Freq: Every day | ORAL | 1 refills | Status: AC

## 2019-08-18 NOTE — Patient Instructions (Signed)
Postmenopausal Bleeding  Postmenopausal bleeding is any bleeding that occurs after menopause. Menopause is when a woman's period stops. Any type of bleeding after menopause should be checked by your doctor. Treatment will depend on the cause. Follow these instructions at home:  Pay attention to any changes in your symptoms.  Avoid using tampons and douches as told by your doctor.  Change your pads regularly.  Get regular pelvic exams and Pap tests.  Take iron pills as told by your doctor.  Take over-the-counter and prescription medicines only as told by your doctor.  Keep all follow-up visits as told by your doctor. This is important. Contact a doctor if:  Your bleeding lasts for more than 1 week.  You have pain in your belly (abdomen).  You have bleeding during or after sex.  You have bleeding that happens more often than every 3 weeks. Get help right away if:  You have fever, chills, headache, dizziness, muscle aches, or bleeding.  You have very bad pain with bleeding.  You have clumps of blood (blood clots) coming from your vagina.  You have a lot of bleeding, and: ? You use more than 1 pad an hour. ? This kind of bleeding has never happened before.  You feel like you are going to pass out (faint). Summary  Any type of bleeding after menopause should be checked by your doctor.  Pay attention to any changes in your symptoms.  Keep all follow-up visits as told by your doctor. This information is not intended to replace advice given to you by your health care provider. Make sure you discuss any questions you have with your health care provider. Document Revised: 06/11/2018 Document Reviewed: 04/30/2016 Elsevier Patient Education  2020 Elsevier Inc.  

## 2019-08-19 ENCOUNTER — Telehealth: Payer: Self-pay | Admitting: *Deleted

## 2019-08-19 ENCOUNTER — Other Ambulatory Visit: Payer: Self-pay | Admitting: Internal Medicine

## 2019-08-19 ENCOUNTER — Ambulatory Visit: Payer: No Typology Code available for payment source | Admitting: Obstetrics & Gynecology

## 2019-08-19 NOTE — Telephone Encounter (Signed)
AEX 01/2018 OV 03/2018 for PUS H/o multiple fibroids  PMP- no HRT uses CBD oil and black cohash tea for vasomotor sx.   Spoke with pt. Pt states was calling to have OV with Dr Sabra Heck as recommended by Dr Kandis Fantasia. Pt states had 1 week in March 2021 with regular bleeding cycle. Pt states had not bled since April 2020. Denies heavy bleeding, clots. Only slight cramps. Denies any bleeding now. Seeing Dr Baird Cancer for annual exams and follow ups.  Pt advised to have OV with Dr Sabra Heck to discuss plan of care. Pt agreeable. Pt scheduled 09/16/19 at 1130 with Dr Sabra Heck per pts request. Pt offered multiple earlier appts. Pt declines due to work schedule. Pt verbalized understanding of date and time of appt. Pt states had Covid vaccine series. Pt advised of heavy bleeding precautions between now and appt. Pt agreeable.   Routing to Dr Sabra Heck for review.  Encounter closed.

## 2019-08-19 NOTE — Telephone Encounter (Signed)
Patient's pcp dr Glendale Chard is referring patient for postmenopausal bleeding. [place] patient.

## 2019-08-28 NOTE — Progress Notes (Signed)
Virtual Visit via Video   This visit type was conducted due to national recommendations for restrictions regarding the COVID-19 Pandemic (e.g. social distancing) in an effort to limit this patient's exposure and mitigate transmission in our community.  Due to her co-morbid illnesses, this patient is at least at moderate risk for complications without adequate follow up.  This format is felt to be most appropriate for this patient at this time.  All issues noted in this document were discussed and addressed.  A limited physical exam was performed with this format.    This visit type was conducted due to national recommendations for restrictions regarding the COVID-19 Pandemic (e.g. social distancing) in an effort to limit this patient's exposure and mitigate transmission in our community.  Patients identity confirmed using two different identifiers.  This format is felt to be most appropriate for this patient at this time.  All issues noted in this document were discussed and addressed.  No physical exam was performed (except for noted visual exam findings with Video Visits).    Date:  08/28/2019   ID:  Adrienne Joyce, Adrienne Joyce [birth year], MRN [id number]  Patient Location:  Home  Provider location:   Office  Chief Complaint:  "I need refill of my thyroid meds."  History of Present Illness:    Adrienne Joyce is a 54 y.o. female who presents via video conferencing for a telehealth visit today.    The patient does not have symptoms concerning for COVID-19 infection (fever, chills, cough, or new shortness of breath).   She presents today for virtual visit. She prefers this method of contact due to COVID-19 pandemic.  She preferred virtual visit also due to gas shortage. She had thyroid bloodwork last week and needs refill of her meds.  Thyroid Problem Presents for follow-up visit. Symptoms include menstrual problem and weight gain. Patient reports no visual change.      Past Medical History:  Diagnosis Date  . Fibroid   . GERD (gastroesophageal reflux disease)   . Thyroid disease    Past Surgical History:  Procedure Laterality Date  . HYSTEROSCOPY  2015  . NASAL POLYP EXCISION  2012   with septoplasty     Current Meds  Medication Sig  . Multiple Vitamin (MULTIVITAMIN) tablet Take 1 tablet by mouth daily.  Marland Kitchen thyroid (ARMOUR THYROID) 60 MG tablet Take 1 tablet (60 mg total) by mouth daily.  . [DISCONTINUED] thyroid (ARMOUR THYROID) 15 MG tablet Take 1 tablet (15 mg total) by mouth daily.  . [DISCONTINUED] thyroid (ARMOUR THYROID) 60 MG tablet Take 1 tablet (60 mg total) by mouth daily.     Allergies:   Morphine and related, Morpholine salicylate, and Macrobid [nitrofurantoin]   Social History   Tobacco Use  . Smoking status: Never Smoker  . Smokeless tobacco: Never Used  Substance Use Topics  . Alcohol use: Yes    Alcohol/week: 0.0 - 1.0 standard drinks    Comment: occasionally  . Drug use: Not Currently     Family Hx: The patient's family history includes Acromegaly in her mother; Alcohol abuse in her father; Colon cancer (age of onset: 81) in her mother; Diabetes in her father; Hyperlipidemia in her father; Hypertension in her father.  ROS:   Please see the history of present illness.    Review of Systems  Constitutional: Positive for weight gain.  Respiratory: Negative.   Cardiovascular: Negative.   Gastrointestinal: Negative.   Genitourinary: Positive for menstrual problem.  She has experienced some vaginal bleeding when she missed some doses of progesterone. She has not had a cycle in over a year. She has noticed that her hot flashes improve when she misses her dose of progesterone.   Neurological: Negative.   Psychiatric/Behavioral: Negative.     All other systems reviewed and are negative.   Labs/Other Tests and Data Reviewed:    Recent Labs: 08/12/2019: TSH 2.020   Recent Lipid Panel Lab Results  Component  Value Date/Time   CHOL 197 12/03/2018 08:00 AM   TRIG 115 12/03/2018 08:00 AM   HDL 71 12/03/2018 08:00 AM   CHOLHDL 2.8 12/03/2018 08:00 AM   LDLCALC 103 (H) 12/03/2018 08:00 AM    Wt Readings from Last 3 Encounters:  08/18/19 151 lb (68.5 kg)  01/28/19 143 lb 12.8 oz (65.2 kg)  12/31/18 145 lb 6.4 oz (66 kg)     Exam:    Vital Signs:  BP (!) 98/58   Pulse 80   Temp 98 F (36.7 C)   Wt 151 lb (68.5 kg)   BMI 27.27 kg/m     Physical Exam  Constitutional: She is oriented to person, place, and time and well-developed, well-nourished, and in no distress.  HENT:  Head: Normocephalic and atraumatic.  Pulmonary/Chest: Effort normal.  Musculoskeletal:     Cervical back: Normal range of motion.  Neurological: She is alert and oriented to person, place, and time.  Psychiatric: Affect normal.  Nursing note and vitals reviewed.   ASSESSMENT & PLAN:     1. Primary hypothyroidism  Labs drawn on 5/6- results reviewed during her visit. TSH is 2, goal is 1.0.  She is advised to take Armour Thyroid 60mg  daily and 30mg  M-F while  skip weekends (of 30mg  dose). I will recheck a thyroid panel in 10-12 weeks for re-evaluation.   2. Postmenopausal bleeding  She agrees to GYN referral. Pt advised workup may involve vaginal u/s and endometrial biopsy.   - Ambulatory referral to Gynecology  3. Female climacteric state  She will continue with progesterone nightly. Refills will be called into [place]. If she continues to have hot flashes, I will decrease dose of progesterone with her next refill.     COVID-19 Education: The signs and symptoms of COVID-19 were discussed with the patient and how to seek care for testing (follow up with PCP or arrange E-visit).  The importance of social distancing was discussed today.  Patient Risk:   After full review of this patients clinical status, I feel that they are at least moderate risk at this time.   Medication Adjustments/Labs  and Tests Ordered: Current medicines are reviewed at length with the patient today.  Concerns regarding medicines are outlined above.   Tests Ordered: Orders Placed This Encounter  Procedures  . Ambulatory referral to Gynecology    Medication Changes: Meds ordered this encounter  Medications  . thyroid (ARMOUR THYROID) 60 MG tablet    Sig: Take 1 tablet (60 mg total) by mouth daily.    Dispense:  90 tablet    Refill:  1  . thyroid (ARMOUR THYROID) 30 MG tablet    Sig: Take 1 tablet (30 mg total) by mouth daily before breakfast. Take Monday through Friday ONLY, skip the weekends    Dispense:  90 tablet    Refill:  1    Disposition:  Follow up in 3 month(s)  Signed, Maximino Greenland, MD

## 2019-09-13 ENCOUNTER — Telehealth: Payer: Self-pay

## 2019-09-13 ENCOUNTER — Ambulatory Visit: Payer: No Typology Code available for payment source | Admitting: Internal Medicine

## 2019-09-13 NOTE — Telephone Encounter (Signed)
The pt was asked if she completed her saliva kit because her appt today was for results.  The pt said no she has to mail her kit off and that she didn't realize that her appt today was for results.  The pt said she will call to reschedule the appt when she mails the it off 

## 2019-09-15 ENCOUNTER — Other Ambulatory Visit: Payer: Self-pay

## 2019-09-16 ENCOUNTER — Encounter: Payer: Self-pay | Admitting: Obstetrics & Gynecology

## 2019-09-16 ENCOUNTER — Other Ambulatory Visit (HOSPITAL_COMMUNITY)
Admission: RE | Admit: 2019-09-16 | Discharge: 2019-09-16 | Disposition: A | Discharge status: Home / Self Care | Payer: No Typology Code available for payment source | Source: Ambulatory Visit | Attending: Obstetrics & Gynecology | Admitting: Obstetrics & Gynecology

## 2019-09-16 ENCOUNTER — Ambulatory Visit (INDEPENDENT_AMBULATORY_CARE_PROVIDER_SITE_OTHER): Payer: No Typology Code available for payment source | Admitting: Obstetrics & Gynecology

## 2019-09-16 VITALS — BP 110/76 | HR 80 | Temp 97.3°F | Resp 16 | Wt 153.0 lb

## 2019-09-16 DIAGNOSIS — R3915 Urgency of urination: Secondary | ICD-10-CM | POA: Diagnosis not present

## 2019-09-16 DIAGNOSIS — N950 Postmenopausal bleeding: Secondary | ICD-10-CM

## 2019-09-16 DIAGNOSIS — K2100 Gastro-esophageal reflux disease with esophagitis, without bleeding: Secondary | ICD-10-CM | POA: Insufficient documentation

## 2019-09-16 DIAGNOSIS — N390 Urinary tract infection, site not specified: Secondary | ICD-10-CM | POA: Diagnosis not present

## 2019-09-16 LAB — POCT URINALYSIS DIPSTICK
Bilirubin, UA: NEGATIVE
Blood, UA: NEGATIVE
Glucose, UA: NEGATIVE
Ketones, UA: NEGATIVE
Leukocytes, UA: NEGATIVE
Nitrite, UA: NEGATIVE
Protein, UA: NEGATIVE
Urobilinogen, UA: NEGATIVE E.U./dL — AB
pH, UA: 5 (ref 5.0–8.0)

## 2019-09-16 MED ORDER — SULFAMETHOXAZOLE-TRIMETHOPRIM 800-160 MG PO TABS: 1.0000 | ORAL_TABLET | Freq: Two times a day (BID) | ORAL | 0 refills | Status: DC

## 2019-09-16 NOTE — Progress Notes (Signed)
GYNECOLOGY  VISIT  CC:   PMP bleeding  HPI: 54 y.o. G2P2002 Married Other or two or more races female here for postmenopausal bleeding that started 06/21/2019.  Bleeding lasted for 5 days.  Saw Dr. Baird Cancer last month who recommended she be seen before AEX in a few weeks.  Pt reports she felt like she was going to have a cycle right before the bleeding started including bloating and pelvic fullness.  She reports she's had urinary urgency, frequency and increased urine odor over the past week.  Has been on Uribel with improvement in symptoms.  Is going to [facility] to a wedding this weekend and is a little nervous about going away from her providers if has UTI.    Last pap:  02/02/2018.  Neg with neg HR HPV.  Has AEX scheduled in a few weeks.  Can do breast exam today so does not have to return for AEX.  Colonoscopy 2020 BMD not done yet Tdap 2017 Declines shingrix vaccination [facility] 2019.  Aware this is due.  Will check about facilities closer to her and let me know if needs order faxed/sent.  GYNECOLOGIC HISTORY: No LMP recorded. (Menstrual status: Other). Contraception: husband vasectomy Menopausal hormone therapy: none  Patient Active Problem List   Diagnosis Date Noted  . Chest wall discomfort 11/29/2018  . Neck discomfort 11/29/2018  . Fibroid uterus 02/02/2018  . Hypothyroidism 12/27/2017    Past Medical History:  Diagnosis Date  . Fibroid   . GERD (gastroesophageal reflux disease)   . Thyroid disease     Past Surgical History:  Procedure Laterality Date  . HYSTEROSCOPY  2015  . NASAL POLYP EXCISION  2012   with septoplasty    MEDS:   Current Outpatient Medications on File Prior to Visit  Medication Sig Dispense Refill  . Multiple Vitamin (MULTIVITAMIN) tablet Take 1 tablet by mouth daily.    Marland Kitchen thyroid (ARMOUR THYROID) 30 MG tablet Take 1 tablet (30 mg total) by mouth daily before breakfast. Take Monday through Friday ONLY, skip the weekends 90 tablet 1  . thyroid (ARMOUR  THYROID) 60 MG tablet Take 1 tablet (60 mg total) by mouth daily. 90 tablet 1   No current facility-administered medications on file prior to visit.    ALLERGIES: Morphine and related, Morpholine salicylate, Macrobid [nitrofurantoin], and Latex  Family History  Problem Relation Age of Onset  . Colon cancer Mother 11       carcinoid tumor  . Acromegaly Mother   . Diabetes Father   . Hypertension Father   . Alcohol abuse Father   . Hyperlipidemia Father     SH:  Married, non smoker  Review of Systems  Constitutional: Negative.   HENT: Negative.   Eyes: Negative.   Respiratory: Negative.   Cardiovascular: Negative.   Gastrointestinal: Negative.   Endocrine: Negative.   Genitourinary: Positive for dysuria, frequency and urgency.       Pmb march 15th, 2021, odor with urine  Musculoskeletal: Negative.   Skin: Negative.   Allergic/Immunologic: Negative.   Neurological: Negative.   Hematological: Negative.   Psychiatric/Behavioral: Negative.     PHYSICAL EXAMINATION:    Temp (!) 97.3 F (36.3 C) (Skin)   Wt 153 lb (69.4 kg)   BMI 27.63 kg/m     General appearance: alert, cooperative and appears stated age Breasts: normal appearance, no masses or tenderness Abdomen: soft, non-tender; bowel sounds normal; no masses,  no organomegaly Lymph:  no inguinal LAD noted  Pelvic: External genitalia:  no lesions              Urethra:  normal appearing urethra with no masses, tenderness or lesions              Bartholins and Skenes: normal                 Vagina: normal appearing vagina with normal color and discharge, no lesions              Cervix: no lesions              Bimanual Exam:  Uterus:  Slightly enlarged with mild fullness on lower right of uterus              Adnexa: no mass, fullness, tenderness  Endometrial biopsy recommended.  Discussed with patient.  Verbal and written consent obtained.   Procedure:  Speculum placed.  Cervix visualized and cleansed with betadine  prep.  A single toothed tenaculum was applied to the anterior lip of the cervix.  Endometrial pipelle was advanced through the cervix into the endometrial cavity without difficulty.  Pipelle passed to 7.5cm.  Suction applied and pipelle removed with good tissue sample obtained.  Tenculum removed.  No bleeding noted.  Patient tolerated procedure well.  Chaperone, Royal Hawthorn, CMA, was present for exam.  Assessment: PMP bleeding Urinary urgency/frequency H/o uterine fibroids with PUS 2019 Breast fibroadenoma biopsies 2019  Plan: Endometrial biopsy pending Urine culture pending RX for bactrim DS BID 3 days as she is going out of town.  Will start only if symptoms worsen Pt is going to let me know if needs order for [facility] closer to home

## 2019-09-20 ENCOUNTER — Telehealth: Payer: Self-pay

## 2019-09-20 LAB — URINE CULTURE

## 2019-09-20 LAB — SURGICAL PATHOLOGY

## 2019-09-20 NOTE — Telephone Encounter (Signed)
Results released to [facility] after patient request.

## 2019-09-20 NOTE — Telephone Encounter (Signed)
Patient returned a call to [facility]. She asked for Joy to send her a MyChart message instead of returning her call.

## 2019-09-20 NOTE — Telephone Encounter (Signed)
Left message for call back.

## 2019-09-20 NOTE — Telephone Encounter (Signed)
-----   Message from Megan Salon, MD sent at 09/20/2019  9:10 AM EDT ----- Please let pt know urine culture showed e coli.  It was treated appropriately.  If symptoms have resolved, no repeat culture needed.  Urine micro showed no blood.  Biopsy is still pending.

## 2019-09-21 ENCOUNTER — Telehealth: Payer: Self-pay | Admitting: *Deleted

## 2019-09-21 DIAGNOSIS — N950 Postmenopausal bleeding: Secondary | ICD-10-CM

## 2019-09-21 NOTE — Telephone Encounter (Signed)
Burnice Logan, RN  09/21/2019 1:08 PM EDT Back to Top    Left message to call Sharee Pimple, RN at [facility].

## 2019-09-21 NOTE — Telephone Encounter (Addendum)
Spoke with patient. Advised of results and recommendations as seen below per Dr. Sabra Heck. Patient request to schedule EMB for now, scheduled for 10/19/19 at 4:30pm. Patient declined earlier appts, will be out of town 6/19 -7/5. Order placed for precert.   Patient is requesting a MyChart message from Dr. Sabra Heck with recommendations for most sufficient evaluation, which is going to most effective in ruling out any abnormalities, patient is looking for statistics.  Advised I will forward request to Dr. Sabra Heck to review and our office will f/u with recommendations. Patient verbalizes understanding and is agreeable.    Routing to Dr. Sabra Heck  Cc: Wilson Singer, Magdalene Patricia

## 2019-09-21 NOTE — Telephone Encounter (Signed)
-----   Message from Megan Salon, MD sent at 09/21/2019  6:39 AM EDT ----- Please let pt know her endometrial biopsy was negative.  However, the specimen was small and the pathologist recommended additional testing.  We could either repeat the endometrial biopsy or proceed with ultrasound to evaluate the endometrium.  Ok to schedule whichever the pt desires.  CC:  Royal Hawthorn, CMA

## 2019-09-22 ENCOUNTER — Encounter: Payer: Self-pay | Admitting: Obstetrics & Gynecology

## 2019-09-22 NOTE — Telephone Encounter (Signed)
Following message sent to pt via mychart.  Ok to close encounter.  Adrienne Joyce, The most efficient evaluation is probably with an ultrasound at this point.  An ultrasound is helpful in this situation because it showed the actual endometrial thickness.  If th endometrium is very thin--1 or 32mm thickness for example--then I would only expect a scant specimen and what I've gotten is then adequate.  I would not repeat the biopsy in that situation.  However, if we did an ultrasound and the endometrium was >96mm, I would know I need to do the biopsy again and I could do it when you came for the ultrasound.    Hope that is helpful.  Please let me know if you have additional questions.  Thanks.  Edwinna Areola

## 2019-09-23 LAB — BASIC METABOLIC PANEL
BUN: 12 (ref 4–21)
CO2: 26 — AB (ref 13–22)
Chloride: 102 (ref 99–108)
Creatinine: 0.8 (ref 0.5–1.1)
Glucose: 75
Potassium: 3.7 (ref 3.4–5.3)
Sodium: 140 (ref 137–147)

## 2019-09-23 LAB — LIPID PANEL
Cholesterol: 210 — AB (ref 0–200)
HDL: 76 — AB (ref 35–70)
LDL Cholesterol: 106
LDl/HDL Ratio: 2.8
Triglycerides: 160 (ref 40–160)

## 2019-09-23 LAB — COMPREHENSIVE METABOLIC PANEL
Albumin: 4.5 (ref 3.5–5.0)
Calcium: 9.2 (ref 8.7–10.7)
GFR calc Af Amer: 98
GFR calc non Af Amer: 85
Globulin: 2.1

## 2019-09-23 LAB — HEPATIC FUNCTION PANEL
ALT: 17 (ref 7–35)
AST: 20 (ref 13–35)
Alkaline Phosphatase: 57 (ref 25–125)
Bilirubin, Total: 0.5

## 2019-09-23 NOTE — Telephone Encounter (Signed)
MyChart message not viewed by patient.   Left message advising Dr. Sabra Heck did send MyChart message, please review and return to call Sharee Pimple, RN at [facility] [phone] to advise how you would like to proceed.

## 2019-09-24 ENCOUNTER — Telehealth: Payer: Self-pay | Admitting: Obstetrics & Gynecology

## 2019-09-24 NOTE — Telephone Encounter (Signed)
Call placed to convey benefits for emb.

## 2019-09-24 NOTE — Telephone Encounter (Signed)
Patient returned my call and I conveyed the benefits. Patient understands/agreeable with the benefits. Patient is aware of the cancellation policy. Appointment scheduled 10/19/19

## 2019-10-05 NOTE — Telephone Encounter (Signed)
Spoke with patient. Patient request to proceed with PUS.  EMB cancelled for 10/19/19.  PUS scheduled for 11/04/19 at 3:30pm, consult at 4pm.  Patient declines earlier appt due to work schedule. Patient advised to return call to office if she is able to come in for earlier appt.  Order placed for PUS for precert.  Patient verbalizes understanding and is agreeable.   Routing to provider for final review. Patient is agreeable to disposition. Will close encounter.  Cc: Magdalene Patricia, Hayley Carder

## 2019-10-05 NOTE — Telephone Encounter (Signed)
Janek, Chriss Czar, MD 1 hour ago (10:34 AM)   Sorry I am now just responding, I have been camping in [place] since 6/20. Great, I would rather start with  an ultrasound then. Should I keep the same time appointment on 7/13 which was scheduled for a biopsy?  Sunday Spillers

## 2019-10-05 NOTE — Telephone Encounter (Signed)
See open telephone encounter dated 6/15/521.   Encounter closed.

## 2019-10-06 ENCOUNTER — Telehealth: Payer: Self-pay | Admitting: Obstetrics & Gynecology

## 2019-10-06 NOTE — Telephone Encounter (Signed)
Call to patient. Per DPR, OK to leave message on voicemail.   Left voicemail requesting a return call to [facility] to review benefits for scheduled Pelvic ultrasound with Jerilynn Mages. Edwinna Areola, MD

## 2019-10-12 NOTE — Telephone Encounter (Signed)
Call to patient. Per DPR, OK to leave message on voicemail.  Left voicemail requesting a return call to [facility] to review benefits for scheduled Pelvic ultrasound with Jerilynn Mages. Edwinna Areola, MD

## 2019-10-19 ENCOUNTER — Ambulatory Visit: Payer: Self-pay | Admitting: Obstetrics & Gynecology

## 2019-10-28 ENCOUNTER — Encounter: Payer: Self-pay | Admitting: Internal Medicine

## 2019-11-02 ENCOUNTER — Telehealth: Payer: Self-pay

## 2019-11-02 NOTE — Telephone Encounter (Signed)
Pt calling to cancel PUS for 7/29 due to being sick. Pt rescheduled PUS for 11/25/19 at 1230 pm due to work schedule. Pt agreeable and verbalized understanding of date and time of appt. Cancellation policy reviewed.   Routing to Dr Sabra Heck for review.  Encounter closed.

## 2019-11-02 NOTE — Telephone Encounter (Signed)
Patient left message to cancel [place] on (11/04/19) due to being sick. Patient would like to reschedule.

## 2019-11-04 ENCOUNTER — Other Ambulatory Visit: Payer: Self-pay | Admitting: Obstetrics & Gynecology

## 2019-11-04 ENCOUNTER — Other Ambulatory Visit: Payer: Self-pay

## 2019-11-05 ENCOUNTER — Telehealth: Payer: No Typology Code available for payment source

## 2019-11-10 ENCOUNTER — Encounter: Payer: Self-pay | Admitting: *Deleted

## 2019-11-10 ENCOUNTER — Telehealth: Payer: Self-pay

## 2019-11-10 ENCOUNTER — Other Ambulatory Visit: Payer: Self-pay | Admitting: *Deleted

## 2019-11-10 NOTE — Telephone Encounter (Signed)
Transition Care Management Follow-up Telephone Call  Date of discharge and from where: 008/05/2019 [facility]  How have you been since you were released from the hospital? No fever 93o2 lever 1 liter of oz  Any questions or concerns? no  Items Reviewed:  Did the pt receive and understand the discharge instructions provided? yes   Medications obtained and verified?yes   Any new allergies since your discharge?no  Dietary orders reviewed?no  Do you have support at home?yes   Other (ie: DME, Home Health, etc) DME [facility]   Functional Questionnaire: (I = Independent and D = Dependent)  Bathing/Dressing-i   Meal Prep-i  Eating-i  Maintaining continence- i  Transferring/Ambulation- i  Managing Meds- i   Follow up appointments reviewed:    PCP Hospital f/u appt confirmed? 11/11/19 Ill[facility] Silver f/u appt confirmed?no  Are transportation arrangements needed?no  If their condition worsens, is the pt aware to call  their PCP or go to the ED? yes  Was the patient provided with contact information for the PCP's office or ED? Yes  Was the pt encouraged to call back with questions or concerns? yes

## 2019-11-10 NOTE — Patient Outreach (Signed)
[facility]) Care Management  11/10/2019  [facility] 09-Oct-1965 [id number]   Transition of care call/case closure   Referral received:11/05/19 Initial outreach:11/08/19 Insurance: U[employer]    Subjective: Initial successful telephone call to patient's preferred number in order to complete transition of care assessment; 2 HIPAA identifiers verified. Explained purpose of call and completed transition of care assessment.  Adrienne Joyce states that she is slowly improving day. She reports that she continues to use oxygen at 1 liter prn, she states her oxygen saturation is 93-94% at night . She continues to use her  flutter valve encouraged 10 per hour as tolerated.  She discussed still having a cough with occasional clear sputum. She checked temperature during visit  97.6. She discussed tolerating mobility in home. She reports that her appetite is low and she is focusing getting more protein in, reinforced staying hydrated and balancing rest with activity.   Spouse/children are assisting with her  recovery. 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   She states that she does have the hospital indemnity, provided contact number for [employer] to file a claim . Provided patient benefits office contact number .  She discussed that she has received a call from [facility] her case is worker comp.  She uses a Company secretary outpatient pharmacy at [facility].      Objective:  Adrienne Joyce  was hospitalized at [facility] 7/30-11/08/19 for Pneumonia Covid 19 Comorbidities include: Hypothyroidism , GERD. She  was discharged to home on 11/08/19 with oxygen    Assessment:  Patient voices good understanding of all discharge instructions.  See transition of care flowsheet for assessment details.   Plan:  Reviewed hospital discharge diagnosis of covid 19, Pneumonia.    and discharge treatment plan using hospital discharge instructions, assessing medication adherence, reviewing  problems requiring provider notification, and discussing the importance of follow up with surgeon, primary care provider and/or specialists as directed.  Reviewed [facility] healthy lifestyle program information to receive discounted premium for  2022   Step 1: Get  your annual physical  Step 2: Complete your health assessment  Step 3:Identify your current health status and complete the corresponding action step between January 1, and December 08, 2019.      No ongoing care management needs identified so will close case to [facility] services and route successful outreach letter with [facility] Management pamphlet and 24 Hour Nurse Line Magnet to [place] Management clinical pool to be mailed to patient's home address.  Thanked patient for their services to [facility]   Adrienne Draft, RN, BSN  [place] Management Coordinator  [phone]- Mobile [phone]- Toll Free Main Office

## 2019-11-11 ENCOUNTER — Other Ambulatory Visit: Payer: Self-pay

## 2019-11-11 ENCOUNTER — Encounter: Payer: Self-pay | Admitting: Nurse Practitioner

## 2019-11-11 ENCOUNTER — Telehealth (INDEPENDENT_AMBULATORY_CARE_PROVIDER_SITE_OTHER): Payer: No Typology Code available for payment source | Admitting: Nurse Practitioner

## 2019-11-11 VITALS — BP 112/82 | HR 97 | Temp 97.6°F

## 2019-11-11 DIAGNOSIS — J1282 Pneumonia due to coronavirus disease 2019: Secondary | ICD-10-CM

## 2019-11-11 DIAGNOSIS — U071 COVID-19: Secondary | ICD-10-CM | POA: Diagnosis not present

## 2019-11-11 NOTE — Progress Notes (Signed)
Virtual Visit via video   This visit type was conducted due to national recommendations for restrictions regarding the COVID-19 Pandemic (e.g. social distancing) in an effort to limit this patient's exposure and mitigate transmission in our community.  Due to her co-morbid illnesses, this patient is at least at moderate risk for complications without adequate follow up.  This format is felt to be most appropriate for this patient at this time.  All issues noted in this document were discussed and addressed.  A limited physical exam was performed with this format.    This visit type was conducted due to national recommendations for restrictions regarding the COVID-19 Pandemic (e.g. social distancing) in an effort to limit this patient's exposure and mitigate transmission in our community.  Patients identity confirmed using two different identifiers.  This format is felt to be most appropriate for this patient at this time.  All issues noted in this document were discussed and addressed.  No physical exam was performed (except for noted visual exam findings with Video Visits).    Date:  11/16/2019   ID:  Adrienne Joyce, Adrienne Joyce 1966-04-08, MRN [id number]  Patient Location:  Home - spoke with [name]  Provider location:   Office    Chief Complaint:  Hospital follow up   History of Present Illness:    Adrienne Joyce is a 54 y.o. female who presents via video conferencing for a telehealth visit today.    The patient does have symptoms concerning for COVID-19 infection (fever, chills, cough, or new shortness of breath).   Patient present today for a virtual visit. This method is preferred due to the COVID-19 pandemic. Patient is currently covid positive. She stated that her symptoms are getting better. Her current oxygen level is 93 without oxygen concentrator and 96 with. She has a mild cough. She had fever up to 102 for two days.  She also had body aches and  headache. She was sleeping 12-16 hours and was not hydrated. Her husband is sick as well.   She took remdesivir. Dexamethasone 6 mg.  For 5 more days.  She is on 1 l/m. Without stays at 93%.  When she uses the acopella. When she slept on her stomach felt like fluid.  Was having to take frequent naps.  When she is moving and taking a shower moving.  She qualify for home hospital program.  She is out of work until August 20th.  She can do in [place] as well.    [name] (case Freight forwarder) [phone].      Past Medical History:  Diagnosis Date  . Fibroid   . GERD (gastroesophageal reflux disease)   . Thyroid disease    Past Surgical History:  Procedure Laterality Date  . HYSTEROSCOPY  2015  . NASAL POLYP EXCISION  2012   with septoplasty     Current Meds  Medication Sig  . Multiple Vitamin (MULTIVITAMIN) tablet Take 1 tablet by mouth daily.  Marland Kitchen thyroid (ARMOUR THYROID) 30 MG tablet Take 1 tablet (30 mg total) by mouth daily before breakfast. Take Monday through Friday ONLY, skip the weekends  . thyroid (ARMOUR THYROID) 60 MG tablet Take 1 tablet (60 mg total) by mouth daily.     Allergies:   Morphine and related, Morpholine salicylate, Macrobid [nitrofurantoin], and Latex   Social History   Tobacco Use  . Smoking status: Never Smoker  . Smokeless tobacco: Never Used  Vaping Use  . Vaping Use: Never used  Substance  Use Topics  . Alcohol use: Yes    Comment: 1 a month  . Drug use: Not Currently     Family Hx: The patient's family history includes Acromegaly in her mother; Alcohol abuse in her father; Colon cancer (age of onset: 61) in her mother; Diabetes in her father; Hyperlipidemia in her father; Hypertension in her father.  ROS:   Please see the history of present illness.    Review of Systems  Constitutional: Positive for malaise/fatigue.  Respiratory: Positive for cough and shortness of breath.   Cardiovascular: Negative.   Gastrointestinal: Negative.   Neurological:  Negative.  Negative for dizziness and headaches.  Psychiatric/Behavioral: Negative.     All other systems reviewed and are negative.   Labs/Other Tests and Data Reviewed:    Recent Labs: 08/12/2019: TSH 2.020 09/23/2019: ALT 17; BUN 12; Creatinine 0.8; Potassium 3.7; Sodium 140   Recent Lipid Panel Lab Results  Component Value Date/Time   CHOL 210 (A) 09/23/2019 12:00 AM   CHOL 197 12/03/2018 08:00 AM   TRIG 160 09/23/2019 12:00 AM   HDL 76 (A) 09/23/2019 12:00 AM   HDL 71 12/03/2018 08:00 AM   CHOLHDL 2.8 12/03/2018 08:00 AM   LDLCALC 106 09/23/2019 12:00 AM   LDLCALC 103 (H) 12/03/2018 08:00 AM    Wt Readings from Last 3 Encounters:  09/16/19 153 lb (69.4 kg)  08/18/19 151 lb (68.5 kg)  01/28/19 143 lb 12.8 oz (65.2 kg)     Exam:    Vital Signs:  BP 112/82   Pulse 97   Temp 97.6 F (36.4 C) (Oral)   SpO2 93%     Physical Exam Constitutional:      General: She is not in acute distress. Pulmonary:     Comments: Slight distress when talking for long period Neurological:     General: No focal deficit present.     Mental Status: She is alert.  Psychiatric:        Mood and Affect: Mood normal.        Behavior: Behavior normal.        Thought Content: Thought content normal.        Judgment: Judgment normal.     ASSESSMENT & PLAN:     1. Pneumonia due to COVID-19 virus  TCM Performed. A member of the clinical team spoke with the patient upon dischare. Discharge summary was reviewed in full detail during the visit. Meds reconciled and compared to discharge meds. Medication list is updated and reviewed with the patient.  Greater than 50% face to face time was spent in counseling an coordination of care.  All questions were answered to the satisfaction of the patient.    Encouraged to continue with oxygen as needed and will take some time to improve  Will call the number she provided to see if there is a team of providers to see her at home  I do recommend she  have a CXR in 4 weeks to follow up pneumonia.    COVID-19 Education: The signs and symptoms of COVID-19 were discussed with the patient and how to seek care for testing (follow up with PCP or arrange E-visit).  The importance of social distancing was discussed today.  Patient Risk:   After full review of this patients clinical status, I feel that they are at least moderate risk at this time.  Time:   Today, I have spent 20 minutes/ seconds with the patient with telehealth technology discussing above diagnoses.  Medication Adjustments/Labs and Tests Ordered: Current medicines are reviewed at length with the patient today.  Concerns regarding medicines are outlined above.   Tests Ordered: Orders Placed This Encounter  Procedures  . DG Chest 2 View    Medication Changes: No orders of the defined types were placed in this encounter.   Disposition:  Follow up September 1st  Signed, Minette Brine, FNP

## 2019-11-17 ENCOUNTER — Telehealth: Payer: Self-pay | Admitting: Internal Medicine

## 2019-11-17 ENCOUNTER — Encounter (INDEPENDENT_AMBULATORY_CARE_PROVIDER_SITE_OTHER): Payer: Self-pay

## 2019-11-17 NOTE — Telephone Encounter (Signed)
Answered Yes on BPA for worsening symptoms. Pt questioned and she stated she was stable, her blood oxygen level via pulse ox was 95 at rest and 93 if active for 10 min. She states she has oxygen if needed but does not feel like she needs it. Instructed to use as directed and call back if symptoms worsen.

## 2019-11-19 ENCOUNTER — Telehealth: Payer: Self-pay

## 2019-11-19 DIAGNOSIS — Z01419 Encounter for gynecological examination (general) (routine) without abnormal findings: Secondary | ICD-10-CM

## 2019-11-19 DIAGNOSIS — N950 Postmenopausal bleeding: Secondary | ICD-10-CM

## 2019-11-19 MED FILL — ARMOUR THYROID 30 MG TABLET: 30 | 90 days supply | Qty: 64 | Fill #1

## 2019-11-19 MED FILL — ARMOUR THYROID 60 MG TABLET: 60 | 90 days supply | Qty: 90 | Fill #1

## 2019-11-19 NOTE — Telephone Encounter (Signed)
Patient cancelled [place] due to having covid. Patient would like to reschedule.

## 2019-11-19 NOTE — Addendum Note (Signed)
Addended by: Georgia Lopes on: 11/19/2019 01:55 PM   Modules accepted: Orders

## 2019-11-19 NOTE — Telephone Encounter (Signed)
Spoke with pt. Pt states having Covid+ and cancelled PUS with Dr Sabra Heck on 11/25/19. Pt requesting to have PUS near her since living in [place] and working in [place]. Pt states would like orders be sent to have PUS at [place].  Future orders placed  Pt also requesting order for 3D screening MMG. Order placed to have at [facility] as well.  Advised pt will update Dr Sabra Heck when she is back in town and return call if any other recommendations. Pt agreeable.  Routing to Dr Sabra Heck for [facility] and update  Encounter closed.

## 2019-11-22 ENCOUNTER — Encounter: Payer: Self-pay | Admitting: Nurse Practitioner

## 2019-11-23 ENCOUNTER — Telehealth (INDEPENDENT_AMBULATORY_CARE_PROVIDER_SITE_OTHER): Payer: No Typology Code available for payment source | Admitting: Internal Medicine

## 2019-11-23 ENCOUNTER — Other Ambulatory Visit: Payer: Self-pay

## 2019-11-23 ENCOUNTER — Telehealth: Payer: Self-pay

## 2019-11-23 ENCOUNTER — Encounter: Payer: Self-pay | Admitting: Internal Medicine

## 2019-11-23 VITALS — BP 136/80 | HR 78 | Temp 98.6°F | Ht 62.6 in | Wt 147.0 lb

## 2019-11-23 DIAGNOSIS — Z8616 Personal history of COVID-19: Secondary | ICD-10-CM

## 2019-11-23 DIAGNOSIS — E039 Hypothyroidism, unspecified: Secondary | ICD-10-CM | POA: Diagnosis not present

## 2019-11-23 DIAGNOSIS — N951 Menopausal and female climacteric states: Secondary | ICD-10-CM

## 2019-11-23 DIAGNOSIS — E663 Overweight: Secondary | ICD-10-CM | POA: Diagnosis not present

## 2019-11-23 NOTE — Telephone Encounter (Signed)
PT CONSENTS TO VIRTUAL VISIT 8/17

## 2019-11-23 NOTE — Patient Instructions (Signed)
Menopause and Hormone Replacement Therapy Menopause is a normal time of life when menstrual periods stop completely and the ovaries stop producing the female hormones estrogen and progesterone. This lack of hormones can affect your health and cause undesirable symptoms. Hormone replacement therapy (HRT) can relieve some of those symptoms. What is hormone replacement therapy? HRT is the use of artificial (synthetic) hormones to replace hormones that your body has stopped producing because you have reached menopause. What are my options for HRT?  HRT may consist of the synthetic hormones estrogen and progestin, or it may consist of only estrogen (estrogen-only therapy). You and your health care provider will decide which form of HRT is best for you. If you choose to be on HRT and you have a uterus, estrogen and progestin are usually prescribed. Estrogen-only therapy is used for women who do not have a uterus. Possible options for taking HRT include:  Pills.  Patches.  Gels.  Sprays.  Vaginal cream.  Vaginal rings.  Vaginal inserts. The amount of hormone(s) that you take and how long you take the hormone(s) varies according to your health. It is important to:  Begin HRT with the lowest possible dosage.  Stop HRT as soon as your health care provider tells you to stop.  Work with your health care provider so that you feel informed and comfortable with your decisions. What are the benefits of HRT? HRT can reduce the frequency and severity of menopausal symptoms. Benefits of HRT vary according to the kind of symptoms that you have, how severe they are, and your overall health. HRT may help to improve the following symptoms of menopause:  Hot flashes and night sweats. These are sudden feelings of heat that spread over the face and body. The skin may turn red, like a blush. Night sweats are hot flashes that happen while you are sleeping or trying to sleep.  Bone loss (osteoporosis). The  body loses calcium more quickly after menopause, causing the bones to become weaker. This can increase the risk for bone breaks (fractures).  Vaginal dryness. The lining of the vagina can become thin and dry, which can cause pain during sex or cause infection, burning, or itching.  Urinary tract infections.  Urinary incontinence. This is the inability to control when you pass urine.  Irritability.  Short-term memory problems. What are the risks of HRT? Risks of HRT vary depending on your individual health and medical history. Risks of HRT also depend on whether you receive both estrogen and progestin or you receive estrogen only. HRT may increase the risk of:  Spotting. This is when a small amount of blood leaks from the vagina unexpectedly.  Endometrial cancer. This cancer is in the lining of the uterus (endometrium).  Breast cancer.  Increased density of breast tissue. This can make it harder to find breast cancer on a breast X-ray (mammogram).  Stroke.  Heart disease.  Blood clots.  Gallbladder disease.  Liver disease. Risks of HRT can increase if you have any of the following conditions:  Endometrial cancer.  Liver disease.  Heart disease.  Breast cancer.  History of blood clots.  History of stroke. Follow these instructions at home:  Take over-the-counter and prescription medicines only as told by your health care provider.  Get mammograms, pelvic exams, and medical checkups as often as told by your health care provider.  Have Pap tests done as often as told by your health care provider. A Pap test is sometimes called a Pap smear. It   is a screening test that is used to check for signs of cancer of the cervix and vagina. A Pap test can also identify the presence of infection or precancerous changes. Pap tests may be done: ? Every 3 years, starting at age 21. ? Every 5 years, starting after age 30, in combination with testing for human papillomavirus  (HPV). ? More often or less often depending on other medical conditions you have, your age, and other risk factors.  It is up to you to get the results of your Pap test. Ask your health care provider, or the department that is doing the test, when your results will be ready.  Keep all follow-up visits as told by your health care provider. This is important. Contact a health care provider if you have:  Pain or swelling in your legs.  Shortness of breath.  Chest pain.  Lumps or changes in your breasts or armpits.  Slurred speech.  Pain, burning, or bleeding when you urinate.  Unusual vaginal bleeding.  Dizziness or headaches.  Weakness or numbness in any part of your arms or legs.  Pain in your abdomen. Summary  Menopause is a normal time of life when menstrual periods stop completely and the ovaries stop producing the female hormones estrogen and progesterone.  Hormone replacement therapy (HRT) can relieve some of the symptoms of menopause.  HRT can reduce the frequency and severity of menopausal symptoms.  Risks of HRT vary depending on your individual health and medical history. This information is not intended to replace advice given to you by your health care provider. Make sure you discuss any questions you have with your health care provider. Document Revised: 11/25/2017 Document Reviewed: 11/25/2017 Elsevier Patient Education  2020 Elsevier Inc.  

## 2019-11-23 NOTE — Progress Notes (Signed)
Virtual Visit via Video   This visit type was conducted due to national recommendations for restrictions regarding the COVID-19 Pandemic (e.g. social distancing) in an effort to limit this patient's exposure and mitigate transmission in our community.  Due to her co-morbid illnesses, this patient is at least at moderate risk for complications without adequate follow up.  This format is felt to be most appropriate for this patient at this time.  All issues noted in this document were discussed and addressed.  A limited physical exam was performed with this format.    This visit type was conducted due to national recommendations for restrictions regarding the COVID-19 Pandemic (e.g. social distancing) in an effort to limit this patient's exposure and mitigate transmission in our community.  Patients identity confirmed using two different identifiers.  This format is felt to be most appropriate for this patient at this time.  All issues noted in this document were discussed and addressed.  No physical exam was performed (except for noted visual exam findings with Video Visits).    Date:  01/04/2020   ID:  Adrienne Joyce, Adrienne Joyce 19-Nov-1965, MRN [id number]  Patient Location:  Home  Provider location:   Office    Chief Complaint:  "I have BHRT f/u"  History of Present Illness:    Adrienne Joyce is a 54 y.o. female who presents via video conferencing for a telehealth visit today.  She is currently recovering from recent COVID infection which required hospitalization. She is here today for f/u BHRT. She reports she recently stopped all hormones. She has been having issues with weight gain/sleep. Has yet to see GYN for evaluation of postmenopausal bleeding.   The patient does not have symptoms concerning for COVID-19 infection (fever, chills, cough, or new shortness of breath).    Past Medical History:  Diagnosis Date  . Fibroid   . GERD (gastroesophageal reflux disease)     . Thyroid disease    Past Surgical History:  Procedure Laterality Date  . HYSTEROSCOPY  2015  . NASAL POLYP EXCISION  2012   with septoplasty     No outpatient medications have been marked as taking for the 11/23/19 encounter (Video Visit) with Glendale Chard, MD.     Allergies:   Morphine and related, Morpholine salicylate, Macrobid [nitrofurantoin], and Latex   Social History   Tobacco Use  . Smoking status: Never Smoker  . Smokeless tobacco: Never Used  Vaping Use  . Vaping Use: Never used  Substance Use Topics  . Alcohol use: Yes    Comment: 1 a month  . Drug use: Not Currently     Family Hx: The patient's family history includes Acromegaly in her mother; Alcohol abuse in her father; Colon cancer (age of onset: 4) in her mother; Diabetes in her father; Hyperlipidemia in her father; Hypertension in her father.  ROS:   Please see the history of present illness.    Review of Systems  Constitutional: Positive for malaise/fatigue. Negative for weight loss.  Respiratory: Negative.   Cardiovascular: Negative.   Gastrointestinal: Negative.   Neurological: Negative.   Psychiatric/Behavioral: Negative.     All other systems reviewed and are negative.   Labs/Other Tests and Data Reviewed:    Recent Labs: 11/30/2019: B Natriuretic Peptide 26.5 12/09/2019: ALT 93; BUN 9; Creatinine, Ser 0.69; Potassium 4.1; Sodium 139; TSH 5.126   Recent Lipid Panel Lab Results  Component Value Date/Time   CHOL 210 (A) 09/23/2019 12:00 AM   CHOL 197 12/03/2018  08:00 AM   TRIG 160 09/23/2019 12:00 AM   HDL 76 (A) 09/23/2019 12:00 AM   HDL 71 12/03/2018 08:00 AM   CHOLHDL 2.8 12/03/2018 08:00 AM   LDLCALC 106 09/23/2019 12:00 AM   LDLCALC 103 (H) 12/03/2018 08:00 AM    Wt Readings from Last 3 Encounters:  11/23/19 147 lb (66.7 kg)  09/16/19 153 lb (69.4 kg)  08/18/19 151 lb (68.5 kg)     Exam:    Vital Signs:  BP 136/80   Pulse 78   Temp 98.6 F (37 C) (Oral)   Ht 5' 2.6"  (1.59 m)   Wt 147 lb (66.7 kg)   SpO2 96%   BMI 26.37 kg/m     Physical Exam Vitals and nursing note reviewed.  Constitutional:      Appearance: Normal appearance.  HENT:     Head: Normocephalic and atraumatic.  Pulmonary:     Effort: Pulmonary effort is normal.  Musculoskeletal:     Cervical back: Normal range of motion.  Neurological:     Mental Status: She is alert and oriented to person, place, and time.  Psychiatric:        Mood and Affect: Affect normal.     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 Significant for nl estrone, nl estradiol, nl estriol which resulted in low estrogen quotient. She did have low progesterone level along with PG/E2 ratio. I think she would benefit from resuming progesterone supplementation. Lastly, she had normal testosterone and DHEA levels. Encouraged to schedule f/u with GYN for evaluation of postmenopausal bleeding.   2. Primary hypothyroidism Comments: She agrees to repeat thyroid testing. Will adjust meds as needed. It is likely recent COVID infection has changed her supplementation requirements.  - T3, free; Future - T4, Free; Future - TSH; Future  3. Overweight (BMI 25.0-29.9) Comments: BMI 26. Encouraged to slowly increase daily activity as tolerated.   4. Personal history of covid-19 Comments: Encouraged to continue with supplementation of vitamins C and D. She also reports taking an immune booster recommended by naturopathic practitioner.      COVID-19 Education: The signs and symptoms of COVID-19 were discussed with the patient and how to seek care for testing (follow up with PCP or arrange E-visit).  The importance of social distancing was discussed today.  Patient Risk:   After full review of this patients clinical status, I feel that they are at least moderate risk at this time.  Time:   Today, I have spent 64minutes/ seconds with the patient with telehealth  technology discussing above diagnoses.     Medication Adjustments/Labs and Tests Ordered: Current medicines are reviewed at length with the patient today.  Concerns regarding medicines are outlined above.   Tests Ordered: Orders Placed This Encounter  Procedures  . T3, free  . T4, Free  . TSH    Medication Changes: No orders of the defined types were placed in this encounter.   Disposition:  Follow up in 4 month(s)  Signed, Maximino Greenland, MD

## 2019-11-25 ENCOUNTER — Other Ambulatory Visit: Payer: Self-pay

## 2019-11-25 ENCOUNTER — Other Ambulatory Visit: Payer: Self-pay | Admitting: Obstetrics & Gynecology

## 2019-11-26 ENCOUNTER — Other Ambulatory Visit
Admission: RE | Admit: 2019-11-26 | Discharge: 2019-11-26 | Disposition: A | Discharge status: Home / Self Care | Payer: No Typology Code available for payment source | Attending: Internal Medicine | Admitting: Internal Medicine

## 2019-11-26 ENCOUNTER — Other Ambulatory Visit: Payer: Self-pay

## 2019-11-26 DIAGNOSIS — E039 Hypothyroidism, unspecified: Secondary | ICD-10-CM | POA: Diagnosis present

## 2019-11-26 LAB — T4, FREE: Free T4: 0.6 ng/dL — ABNORMAL LOW (ref 0.61–1.12)

## 2019-11-26 LAB — TSH: TSH: 0.506 u[IU]/mL (ref 0.350–4.500)

## 2019-11-27 LAB — T3, FREE: T3, Free: 3.2 pg/mL (ref 2.0–4.4)

## 2019-11-29 ENCOUNTER — Other Ambulatory Visit: Payer: Self-pay

## 2019-11-29 ENCOUNTER — Ambulatory Visit: Payer: No Typology Code available for payment source | Admitting: Obstetrics & Gynecology

## 2019-11-29 ENCOUNTER — Telehealth (INDEPENDENT_AMBULATORY_CARE_PROVIDER_SITE_OTHER): Payer: No Typology Code available for payment source | Admitting: Nurse Practitioner

## 2019-11-29 ENCOUNTER — Encounter: Payer: Self-pay | Admitting: Nurse Practitioner

## 2019-11-29 VITALS — BP 180/92

## 2019-11-29 DIAGNOSIS — R030 Elevated blood-pressure reading, without diagnosis of hypertension: Secondary | ICD-10-CM | POA: Diagnosis not present

## 2019-11-29 DIAGNOSIS — J1282 Pneumonia due to coronavirus disease 2019: Secondary | ICD-10-CM | POA: Diagnosis not present

## 2019-11-29 DIAGNOSIS — M25473 Effusion, unspecified ankle: Secondary | ICD-10-CM

## 2019-11-29 DIAGNOSIS — U071 COVID-19: Secondary | ICD-10-CM | POA: Diagnosis not present

## 2019-11-29 NOTE — Progress Notes (Signed)
Virtual Visit via MyChart   This visit type was conducted due to national recommendations for restrictions regarding the COVID-19 Pandemic (e.g. social distancing) in an effort to limit this patient's exposure and mitigate transmission in our community.  Due to her co-morbid illnesses, this patient is at least at moderate risk for complications without adequate follow up.  This format is felt to be most appropriate for this patient at this time.  All issues noted in this document were discussed and addressed.  A limited physical exam was performed with this format.    This visit type was conducted due to national recommendations for restrictions regarding the COVID-19 Pandemic (e.g. social distancing) in an effort to limit this patient's exposure and mitigate transmission in our community.  Patients identity confirmed using two different identifiers.  This format is felt to be most appropriate for this patient at this time.  All issues noted in this document were discussed and addressed.  No physical exam was performed (except for noted visual exam findings with Video Visits).    Date:  12/05/2019   ID:  Adrienne Joyce, Adrienne Joyce September 09, 1965, MRN [id number]  Patient Location:  Home - spoke with Adrienne Joyce  Provider location:   Office    Chief Complaint:  Follow up covid - no longer quarantined  History of Present Illness:    Adrienne Joyce is a 54 y.o. female who presents via video conferencing for a telehealth visit today.    The patient does not have symptoms concerning for COVID-19 infection (fever, chills, cough, or new shortness of breath).   She is doing much better her energy level is better after having aroma touch therapy.  She is no longer using oxygen.  She did go back to work on Friday 2/3 of her shift.  The worse was wearing an N95 was getting short of breath.  Pulse ox was down to 94% after wearing N95.  She worked 12 hours and her next shift is Friday.   She is planning to go to pulmonary rehab through [facility] she has to cancel in September.    Blood pressure was 90-100/60-70 normally, she has now elevated up to 130-180/90-92. She is having reduced dose of thyroid.  She skipped Saturday and Sunday for now. Pressure on left neck to jaw would ease off for 5-10 minutes.  She was bradycardia when had covid.  Today her heart rate is 99.  Normal is 70-80.  Her mother had colon cancer - checked 12 years ago.  She would have episodes of elevated blood pressure over the last year had never been over 138.    She is having left shoulder stiffness - in the last 3 months unable to hyperextend.    Neck Pain  This is a new problem. The current episode started in the past 7 days. The problem occurs constantly. The patient is experiencing no pain. Nothing aggravates the symptoms. Pertinent negatives include no chest pain or headaches.     Past Medical History:  Diagnosis Date  . Fibroid   . GERD (gastroesophageal reflux disease)   . Thyroid disease    Past Surgical History:  Procedure Laterality Date  . HYSTEROSCOPY  2015  . NASAL POLYP EXCISION  2012   with septoplasty     Current Meds  Medication Sig  . Multiple Vitamin (MULTIVITAMIN) tablet Take 1 tablet by mouth daily.  Marland Kitchen thyroid (ARMOUR THYROID) 30 MG tablet Take 1 tablet (30 mg total) by mouth daily before breakfast.  Take Monday through Friday ONLY, skip the weekends  . thyroid (ARMOUR THYROID) 60 MG tablet Take 1 tablet (60 mg total) by mouth daily.     Allergies:   Morphine and related, Morpholine salicylate, Macrobid [nitrofurantoin], and Latex   Social History   Tobacco Use  . Smoking status: Never Smoker  . Smokeless tobacco: Never Used  Vaping Use  . Vaping Use: Never used  Substance Use Topics  . Alcohol use: Yes    Comment: 1 a month  . Drug use: Not Currently     Family Hx: The patient's family history includes Acromegaly in her mother; Alcohol abuse in her father; Colon  cancer (age of onset: 79) in her mother; Diabetes in her father; Hyperlipidemia in her father; Hypertension in her father.  ROS:   Please see the history of present illness.    Review of Systems  Respiratory: Negative.   Cardiovascular: Negative for chest pain and palpitations.  Musculoskeletal: Positive for neck pain.  Neurological: Negative for dizziness and headaches.    All other systems reviewed and are negative.   Labs/Other Tests and Data Reviewed:    Recent Labs: 09/23/2019: ALT 17; BUN 12; Creatinine 0.8; Potassium 3.7; Sodium 140 11/26/2019: TSH 0.506 11/30/2019: B Natriuretic Peptide 26.5     Recent Lipid Panel Lab Results  Component Value Date/Time   CHOL 210 (A) 09/23/2019 12:00 AM   CHOL 197 12/03/2018 08:00 AM   TRIG 160 09/23/2019 12:00 AM   HDL 76 (A) 09/23/2019 12:00 AM   HDL 71 12/03/2018 08:00 AM   CHOLHDL 2.8 12/03/2018 08:00 AM   LDLCALC 106 09/23/2019 12:00 AM   LDLCALC 103 (H) 12/03/2018 08:00 AM    Wt Readings from Last 3 Encounters:  11/23/19 147 lb (66.7 kg)  09/16/19 153 lb (69.4 kg)  08/18/19 151 lb (68.5 kg)     Exam:    Vital Signs:  BP (!) 180/92 (BP Location: Left Arm, Patient Position: Sitting, Cuff Size: Small)     Physical Exam Constitutional:      General: She is not in acute distress.    Appearance: Normal appearance.  Cardiovascular:     Rate and Rhythm: Normal rate and regular rhythm.  Pulmonary:     Effort: Pulmonary effort is normal. No respiratory distress.     Comments: Able to speak much better without shortness of breath Neurological:     General: No focal deficit present.     Mental Status: She is alert.  Psychiatric:        Mood and Affect: Mood normal.        Behavior: Behavior normal.        Thought Content: Thought content normal.        Judgment: Judgment normal.     ASSESSMENT & PLAN:    1. Elevated blood pressure reading  Since having Covid her blood pressure has been more elevated  Not currently  on any medications for this  She is to check her blood pressure daily to see if remains elevated  Will check EKG, she will have done at [facility] - EKG 12-Lead; Future  2. Ankle edema  Reported ankle edema a few days ago  Will check BNP and BMP due to the ankle edema - Brain natriuretic peptide; Future  3. Pneumonia due to COVID-19 virus  She is sounding much better, has returned to work  Will recheck CXR with her EKG to see if improving.     COVID-19 Education: The signs  and symptoms of COVID-19 were discussed with the patient and how to seek care for testing (follow up with PCP or arrange E-visit).  The importance of social distancing was discussed today.  Patient Risk:   After full review of this patients clinical status, I feel that they are at least moderate risk at this time.  Time:   Today, I have spent 12 minutes/ seconds with the patient with telehealth technology discussing above diagnoses.     Medication Adjustments/Labs and Tests Ordered: Current medicines are reviewed at length with the patient today.  Concerns regarding medicines are outlined above.   Tests Ordered: Orders Placed This Encounter  Procedures  . Brain natriuretic peptide  . EKG 12-Lead    Medication Changes: No orders of the defined types were placed in this encounter.   Disposition:  Follow up prn  Signed, Minette Brine, FNP

## 2019-11-30 ENCOUNTER — Ambulatory Visit
Admission: RE | Admit: 2019-11-30 | Discharge: 2019-11-30 | Disposition: A | Discharge status: Home / Self Care | Payer: No Typology Code available for payment source | Source: Ambulatory Visit | Attending: *Deleted | Admitting: *Deleted

## 2019-11-30 ENCOUNTER — Ambulatory Visit
Admission: RE | Admit: 2019-11-30 | Discharge: 2019-11-30 | Disposition: A | Discharge status: Home / Self Care | Payer: No Typology Code available for payment source | Attending: Nurse Practitioner | Admitting: Nurse Practitioner

## 2019-11-30 ENCOUNTER — Other Ambulatory Visit: Payer: Self-pay | Admitting: Nurse Practitioner

## 2019-11-30 ENCOUNTER — Other Ambulatory Visit
Admission: RE | Admit: 2019-11-30 | Discharge: 2019-11-30 | Disposition: A | Discharge status: Home / Self Care | Payer: No Typology Code available for payment source | Source: Home / Self Care | Attending: Nurse Practitioner | Admitting: Nurse Practitioner

## 2019-11-30 ENCOUNTER — Ambulatory Visit: Payer: Self-pay

## 2019-11-30 ENCOUNTER — Encounter: Payer: Self-pay | Admitting: Internal Medicine

## 2019-11-30 ENCOUNTER — Ambulatory Visit
Admission: RE | Admit: 2019-11-30 | Discharge: 2019-11-30 | Disposition: A | Discharge status: Home / Self Care | Payer: No Typology Code available for payment source | Source: Ambulatory Visit | Attending: Nurse Practitioner | Admitting: Nurse Practitioner

## 2019-11-30 ENCOUNTER — Other Ambulatory Visit: Payer: Self-pay

## 2019-11-30 DIAGNOSIS — J1282 Pneumonia due to coronavirus disease 2019: Secondary | ICD-10-CM | POA: Insufficient documentation

## 2019-11-30 DIAGNOSIS — M25473 Effusion, unspecified ankle: Secondary | ICD-10-CM

## 2019-11-30 DIAGNOSIS — U071 COVID-19: Secondary | ICD-10-CM

## 2019-11-30 DIAGNOSIS — R030 Elevated blood-pressure reading, without diagnosis of hypertension: Secondary | ICD-10-CM

## 2019-11-30 LAB — BRAIN NATRIURETIC PEPTIDE: B Natriuretic Peptide: 26.5 pg/mL (ref 0.0–100.0)

## 2019-12-01 ENCOUNTER — Encounter: Payer: Self-pay | Admitting: Nurse Practitioner

## 2019-12-02 ENCOUNTER — Other Ambulatory Visit: Payer: Self-pay | Admitting: Internal Medicine

## 2019-12-02 DIAGNOSIS — E039 Hypothyroidism, unspecified: Secondary | ICD-10-CM

## 2019-12-02 NOTE — Telephone Encounter (Signed)
Can you send a copy of her CMP to the [facility] she has provided

## 2019-12-06 ENCOUNTER — Other Ambulatory Visit: Payer: Self-pay | Admitting: Nurse Practitioner

## 2019-12-06 DIAGNOSIS — U071 COVID-19: Secondary | ICD-10-CM

## 2019-12-08 ENCOUNTER — Other Ambulatory Visit: Payer: Self-pay | Admitting: Internal Medicine

## 2019-12-08 DIAGNOSIS — E039 Hypothyroidism, unspecified: Secondary | ICD-10-CM

## 2019-12-09 ENCOUNTER — Other Ambulatory Visit: Payer: Self-pay

## 2019-12-09 ENCOUNTER — Other Ambulatory Visit
Admission: RE | Admit: 2019-12-09 | Discharge: 2019-12-09 | Disposition: A | Discharge status: Home / Self Care | Payer: No Typology Code available for payment source | Attending: Nurse Practitioner | Admitting: Nurse Practitioner

## 2019-12-09 DIAGNOSIS — E039 Hypothyroidism, unspecified: Secondary | ICD-10-CM | POA: Diagnosis not present

## 2019-12-09 LAB — COMPREHENSIVE METABOLIC PANEL
ALT: 93 U/L — ABNORMAL HIGH (ref 0–44)
AST: 54 U/L — ABNORMAL HIGH (ref 15–41)
Albumin: 3.8 g/dL (ref 3.5–5.0)
Alkaline Phosphatase: 50 U/L (ref 38–126)
Anion gap: 8 (ref 5–15)
BUN: 9 mg/dL (ref 6–20)
CO2: 28 mmol/L (ref 22–32)
Calcium: 8.9 mg/dL (ref 8.9–10.3)
Chloride: 103 mmol/L (ref 98–111)
Creatinine, Ser: 0.69 mg/dL (ref 0.44–1.00)
GFR calc Af Amer: >60 mL/min (ref >60)
GFR calc non Af Amer: >60 mL/min (ref >60)
Glucose, Bld: 82 mg/dL (ref 70–99)
Potassium: 4.1 mmol/L (ref 3.5–5.1)
Sodium: 139 mmol/L (ref 135–145)
Total Bilirubin: 0.6 mg/dL (ref 0.3–1.2)
Total Protein: 6.3 g/dL — ABNORMAL LOW (ref 6.5–8.1)

## 2019-12-09 LAB — TSH: TSH: 5.126 u[IU]/mL — ABNORMAL HIGH (ref 0.350–4.500)

## 2019-12-09 NOTE — Addendum Note (Signed)
Addended by: Elmo Putt on: 12/09/2019 08:13 AM   Modules accepted: Orders

## 2019-12-09 NOTE — Addendum Note (Signed)
Addended by: Elmo Putt on: 12/09/2019 08:10 AM   Modules accepted: Orders

## 2019-12-10 LAB — THYROID PEROXIDASE ANTIBODY: Thyroperoxidase Ab SerPl-aCnc: 9 IU/mL (ref 0–34)

## 2019-12-14 ENCOUNTER — Encounter: Payer: Self-pay | Admitting: Nurse Practitioner

## 2020-01-04 ENCOUNTER — Encounter: Payer: Self-pay | Admitting: Internal Medicine

## 2020-01-10 ENCOUNTER — Other Ambulatory Visit: Payer: Self-pay | Admitting: Internal Medicine

## 2020-01-10 ENCOUNTER — Encounter: Payer: Self-pay | Admitting: Internal Medicine

## 2020-01-10 DIAGNOSIS — R7989 Other specified abnormal findings of blood chemistry: Secondary | ICD-10-CM

## 2020-01-10 DIAGNOSIS — E039 Hypothyroidism, unspecified: Secondary | ICD-10-CM

## 2020-01-11 ENCOUNTER — Institutional Professional Consult (permissible substitution): Payer: No Typology Code available for payment source | Admitting: Internal Medicine

## 2020-01-14 ENCOUNTER — Ambulatory Visit: Payer: No Typology Code available for payment source

## 2020-01-18 ENCOUNTER — Ambulatory Visit: Payer: No Typology Code available for payment source

## 2020-01-20 ENCOUNTER — Encounter: Payer: Self-pay | Admitting: Internal Medicine

## 2020-01-20 ENCOUNTER — Other Ambulatory Visit
Admission: RE | Admit: 2020-01-20 | Discharge: 2020-01-20 | Disposition: A | Discharge status: Home / Self Care | Payer: No Typology Code available for payment source | Attending: Internal Medicine | Admitting: Internal Medicine

## 2020-01-20 DIAGNOSIS — E039 Hypothyroidism, unspecified: Secondary | ICD-10-CM | POA: Diagnosis not present

## 2020-01-20 DIAGNOSIS — R7989 Other specified abnormal findings of blood chemistry: Secondary | ICD-10-CM | POA: Diagnosis present

## 2020-01-20 LAB — HEPATIC FUNCTION PANEL
ALT: 29 U/L (ref 0–44)
AST: 26 U/L (ref 15–41)
Albumin: 3.1 g/dL — ABNORMAL LOW (ref 3.5–5.0)
Alkaline Phosphatase: 41 U/L (ref 38–126)
Bilirubin, Direct: <0.1 mg/dL (ref 0.0–0.2)
Total Bilirubin: 0.4 mg/dL (ref 0.3–1.2)
Total Protein: 5 g/dL — ABNORMAL LOW (ref 6.5–8.1)

## 2020-01-20 LAB — T4, FREE: Free T4: 0.53 ng/dL — ABNORMAL LOW (ref 0.61–1.12)

## 2020-01-20 LAB — TSH: TSH: 2.314 u[IU]/mL (ref 0.350–4.500)

## 2020-01-20 NOTE — Addendum Note (Signed)
Addended by: Elmo Putt on: 01/20/2020 03:48 PM   Modules accepted: Orders

## 2020-01-25 ENCOUNTER — Ambulatory Visit: Admission: RE | Admit: 2020-01-25 | Payer: No Typology Code available for payment source | Source: Ambulatory Visit

## 2020-01-31 ENCOUNTER — Encounter: Payer: Self-pay | Admitting: Internal Medicine

## 2020-01-31 ENCOUNTER — Ambulatory Visit: Payer: No Typology Code available for payment source

## 2020-02-01 ENCOUNTER — Other Ambulatory Visit: Payer: Self-pay | Admitting: Internal Medicine

## 2020-02-01 ENCOUNTER — Encounter: Payer: Self-pay | Admitting: Internal Medicine

## 2020-02-01 DIAGNOSIS — M79672 Pain in left foot: Secondary | ICD-10-CM

## 2020-02-03 ENCOUNTER — Inpatient Hospital Stay: Admission: RE | Admit: 2020-02-03 | Payer: No Typology Code available for payment source | Source: Ambulatory Visit

## 2020-02-10 ENCOUNTER — Ambulatory Visit
Admission: RE | Admit: 2020-02-10 | Discharge: 2020-02-10 | Disposition: A | Discharge status: Home / Self Care | Payer: No Typology Code available for payment source | Source: Ambulatory Visit | Attending: Obstetrics & Gynecology | Admitting: Obstetrics & Gynecology

## 2020-02-10 ENCOUNTER — Other Ambulatory Visit: Payer: Self-pay

## 2020-02-10 ENCOUNTER — Encounter: Payer: Self-pay | Admitting: Obstetrics & Gynecology

## 2020-02-10 ENCOUNTER — Telehealth: Payer: Self-pay

## 2020-02-10 DIAGNOSIS — N950 Postmenopausal bleeding: Secondary | ICD-10-CM | POA: Diagnosis not present

## 2020-02-10 NOTE — Telephone Encounter (Signed)
Pt sent following mychart message:  Charisse, Wendell [facility] Good morning, I finally got the pelvic ultrasound today that you ordered a few months ago at the [facility] radiology department.  Thanks,          Sunday Spillers

## 2020-02-10 NOTE — Telephone Encounter (Signed)
Routing to Dr Sabra Heck for update on PUS. Please advise

## 2020-02-11 NOTE — Telephone Encounter (Signed)
Please let pt know her uterus still has fibroids, the largest is about 5cm.  These are not new.    Her endometrium was not well visualized due to fibroids but appeared to be about 74mm.  Ovaries were normal.    Ultrasound recommended endometrial evaluation which was done in June when she came for the bleeding.  (She really has waited several months to have the ultrasound done.).  She had a biopsy done in June that had scant tissue.  So, if she's continued to have bleeding/spotting, she needs a hysteroscopy with D&C.  If bleeding has not returned, it is ok to monitor and call if has any future bleeding.  Thanks.  Please give me update from her.  Thanks.

## 2020-02-12 ENCOUNTER — Other Ambulatory Visit: Payer: Self-pay | Admitting: Internal Medicine

## 2020-02-12 DIAGNOSIS — M25511 Pain in right shoulder: Secondary | ICD-10-CM

## 2020-02-14 NOTE — Telephone Encounter (Signed)
Spoke with pt. Pt given update and recommendations per Dr Sabra Heck. Pt agreeable and verbalized understanding. Pt states is not having any vaginal bleeding at this time and will monitor and call with any future bleeding. Pt plans to transfer to Dr Sabra Heck at [place].  Encounter closed

## 2020-02-29 ENCOUNTER — Other Ambulatory Visit: Payer: Self-pay

## 2020-02-29 ENCOUNTER — Ambulatory Visit: Payer: No Typology Code available for payment source | Attending: Podiatry

## 2020-02-29 DIAGNOSIS — M25572 Pain in left ankle and joints of left foot: Secondary | ICD-10-CM | POA: Insufficient documentation

## 2020-02-29 DIAGNOSIS — M25512 Pain in left shoulder: Secondary | ICD-10-CM | POA: Insufficient documentation

## 2020-02-29 DIAGNOSIS — M25511 Pain in right shoulder: Secondary | ICD-10-CM | POA: Diagnosis present

## 2020-02-29 DIAGNOSIS — M25612 Stiffness of left shoulder, not elsewhere classified: Secondary | ICD-10-CM | POA: Diagnosis present

## 2020-02-29 DIAGNOSIS — G8929 Other chronic pain: Secondary | ICD-10-CM | POA: Diagnosis present

## 2020-02-29 NOTE — Patient Instructions (Signed)
Access Code: [email or web address] URL: [email or web address] Date: 02/29/2020 Prepared by: Roxana Hires  Exercises Seated Ankle Alphabet - 2 x daily - 7 x weekly - 1 round through the alphabet to warm-up hold Ankle Inversion with Resistance - 2 x daily - 7 x weekly - 2 sets - 10 reps - 2s hold Ankle Eversion with Resistance - 2 x daily - 7 x weekly - 2 sets - 10 reps - 2s hold Ankle Dorsiflexion with Resistance - 2 x daily - 7 x weekly - 2 sets - 10 reps - 2s hold Single Leg Heel Raise with Unilateral Counter Support - 2 x daily - 7 x weekly - 2 sets - 10 reps - 2s hold Standing Single Leg Stance with Counter Support - 2 x daily - 7 x weekly - 30s x 3 hold

## 2020-02-29 NOTE — Therapy (Signed)
[facility] [address] [place], [place], [address] Phone: [phone]   Fax:  [phone]  Physical Therapy Evaluation  Patient Details  Name: Adrienne Joyce MRN: [id number] Date of Birth: 01-21-1966 Referring Provider (PT): Dr. Vickki Muff   Encounter Date: 02/29/2020   PT End of Session - 02/29/20 1522    Visit Number 1    Number of Visits 17    Date for PT Re-Evaluation 04/25/20    Authorization Type eval: 02/29/20    PT Start Time 1530    PT Stop Time 1630    PT Time Calculation (min) 60 min    Activity Tolerance Patient tolerated treatment well    Behavior During Therapy [facility] for tasks assessed/performed           Past Medical History:  Diagnosis Date  . Fibroid   . GERD (gastroesophageal reflux disease)   . Thyroid disease     Past Surgical History:  Procedure Laterality Date  . HYSTEROSCOPY  2015  . NASAL POLYP EXCISION  2012   with septoplasty    There were no vitals filed for this visit.    Subjective Assessment - 02/29/20 1521    Subjective L ankle sprain    Pertinent History L ankle inversion sprain 02/09/20 after falling in a hole. Initial radiographs were negative for fracture. She was put in walking boot for 5 weeks. Followed-up with podiatry and he performed repeat radiographs with no signs of fracture. She was discharged from his care with instructions to follow-up as needed. Advised to avoid Zumba and stand up paddle boarding until she is ambulating without any pain. She is still having some pain in anterolateral aspect of the ankle occasionally. She does still have some swelling if she is on her feet for an extended period of time. She is not currently wrapping her ankle or using any compression stockings. Initially having some pain near the base of her fifth metatarsal to palpation but now only occurs with passive or active L ankle inversion.    Limitations Walking    Diagnostic tests See  hstory    Patient Stated Goals Decrease pain and return to full function    Currently in Pain? No/denies    Pain Score 0-No pain   Worst: 8/10   Pain Location Ankle    Pain Orientation Left;Lateral    Pain Descriptors / Indicators Sharp    Pain Type Acute pain    Pain Onset 1 to 4 weeks ago    Pain Frequency Intermittent    Aggravating Factors  passive or active inversion    Pain Relieving Factors ice initially, essential oils for inflammation              [facility] PT Assessment - 02/29/20 1521      Assessment   Medical Diagnosis L ankle sprain    Referring Provider (PT) Dr. Vickki Muff    Onset Date/Surgical Date 02/09/20    Hand Dominance Right    Next MD Visit Podiatry prn    Prior Therapy None for ankle      Precautions   Precautions None      Restrictions   Weight Bearing Restrictions No      Balance Screen   Has the patient fallen in the past 6 months Yes    How many times? 1    Has the patient had a decrease in activity level because of a fear of falling?  Yes    Is  the patient reluctant to leave their home because of a fear of falling?  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 Initial radiographs were negative for fracture. She was put in walking boot for 5 weeks. Followed-up with podiatry and he performed repeat radiographs with no signs of fracture. She was discharged from his care with instructions to follow-up as needed. Advised to avoid Zumba and stand up paddle boarding until she is ambulating without any pain. She is still having some  pain in anterolateral aspect of the ankle occasionally. She does still have some swelling if she is on her feet for an extended period of time. She is not currently wrapping her ankle or using any compression stockings. Initially having some pain near the base of her fifth metatarsal to palpation but now only occurs with passive or active L ankle inversion.  Referring Dx: L ankle sprain Referring Provider: Dr. Vickki Muff Pain location: anterolateral aspect of ankle  Pain: Present 0/10, Best 0/10, Worst 8/10 Pain quality: pain quality: sharp Radiating pain: Yes, partially down the fifth metatarsal with active or passive L foot inversion. 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 No ecchymosis or erythema noted. Very mild edema noted over anterolateral ankle. No gross ankle/foot deformity noted  Knee Screen AROM: WFL and painless  Posture No gross abnormalities noted in seated or standing posture  Gait Deferred  Palpation No pain to palpation along medial and lateral  malleoli. Positive pain with palpation over anterolateral ankle over the ATF. Mild pain over anteromedial ankle over deltoid ligament. CFL palpable and painless. No pain at base or along length of 5th metatarsal. No pain Achilles tendon intact and painless to palpation  Strength R/L 5/5 Hip flexion 5/5 Hip external rotation 5/5 Hip internal rotation Strong hip abduction/adduction in sitting 5/5 Knee extension 5/5 Knee flexion (stting) >10/>10 Ankle Plantarflexion, slightly less height on L with more instability 5/5 Ankle Dorsiflexion 5/5 Ankle Inversion 5/5 Ankle Eversion  AROM R/L 56/56 Ankle Plantarflexion 32/26 Ankle Dorsiflexion 26/32* Ankle Inversion 32/22 Ankle Eversion *Indicates Pain  Passive Accessory Motion Superior Tibiofibular Joint: WNL Inferior Tibiofibular Joint: WNL Talocrural Joint Distraction: WNL Talocrural Joint AP: WNL, mild pain  Talocrural Joint PA: WNL, positive reproduction of pain  NEUROLOGICAL:  Mental Status Patient is oriented to person, place and time.  Recent memory is intact.  Remote memory is intact.  Attention span and concentration are intact.  Expressive speech is intact.  Patient's fund of knowledge is within normal limits for educational level.  Sensation Grossly intact to light touch bilateral LEs as determined by testing dermatomes L2-S2 Proprioception and hot/cold testing deferred on this date  Reflexes Deferred  VASCULAR Deferred  SPECIAL TESTS  Ligamentous Integrity Anterior Drawer (ATF, 10-15 plantarflexion with anterior translation): Negative Talar Tilt (CFL, inversion): Negative Eversion Stress Test (Deltoid, eversion): Negative External Rotation Test (High ankle, dorsiflexion and external rotation): Negative Squeeze Test (High ankle): Negative Impingment Sign (Dorsiflexion and eversion): Negative  Achilles Integrity Thompson Test: Negative  Fracture Screening Metatarsal Axial Loading:  Negative Tap/Percussion Test: Not examined Vibration Test: Not examined  Pronation/Supination Navicular Drop: Not examined  Nerve Test Tarsal Tunnel Test (maximal DF, EV, toe ext with tapping over tarsal tunnel): Not examined Test for Morton's Neuroma (compress metatarsals and mobilize): Not examined  Other Windlass Mechanism Test: Not examined      Objective measurements completed on examination: See above findings.      TREATMENT   Ther-ex  Seated ankle alphabet x 1 bout; Seated L ankle plantarflexion yellow tband x 10, red tband x 10; Standing L single leg heel raises with 2-1 UE support x 10; Seated L ankle inversion with yellow tband x 10 Seated L ankle eversion with yellow tband x 10; Seated L ankle dorsiflexion with yello tband x 10; Standing L single leg balance x 30s; Pt provided HEP with education about how to perform correctly;         PT Education - 02/29/20 1522    Education Details Plan of care and HEP    Person(s) Educated Patient    Methods Explanation;Handout    Comprehension Verbalized understanding            PT Short Term Goals - 02/29/20 1648      PT SHORT TERM GOAL #1   Title Pt will be independent with HEP in order to decrease ankle pain and increase pain-free strength/stability in order to improve pain-free function at home and work.    Time 4    Period Weeks    Status New    Target Date 03/28/20             PT Long Term Goals - 02/29/20 1648      PT LONG TERM GOAL #1   Title Pt will decrease worst pain as reported on NPRS by at least 3 points in order to demonstrate clinically significant reduction in ankle/foot pain.    Baseline 02/29/20: worst:8/10    Time 8    Period Weeks    Status New    Target Date 04/25/20      PT LONG TERM GOAL #2   Title Pt will improve FOTO score to at least 79 in order to demonstrate clinically significant improvement in her function related to her L ankle    Baseline 02/29/20: 71     Time 8    Period Weeks    Status New    Target Date 04/25/20      PT LONG TERM GOAL #3   Title Pt will increase LEFS by at least 9 points in order to demonstrate significant improvement in lower extremity function.    Baseline 02/29/20: to be completed at next session  Time 8    Period Weeks    Status New    Target Date 04/25/20                  Plan - 02/29/20 1524    Clinical Impression Statement Pt is a pleasant 54 year-old female referred to physical therapy s/p L ankle inversion sprain. Strength and range of motion are WNL. Ligaments all appear strong to testing with some mild pain reported with anterior drawer testing. Pt does have positive reproduction of her pain at anterolateral ankle over ATF ligament with both active and passive inversion. No pain to palpation along medial and lateral malleoli. Positive pain with palpation over anterolateral ankle over the ATF. Mild pain over anteromedial ankle over deltoid ligament. Pt provided HEP to progress painless ankle strengthening and single leg stability.  She will benefit from PT services to address deficits in L ankle pain in order to return to full function at home and with leisure activities without pain.    Personal Factors and Comorbidities Age;Comorbidity 1    Comorbidities thyroid disease    Examination-Participation Restrictions Occupation;Community Activity    Stability/Clinical Decision Making Stable/Uncomplicated    Clinical Decision Making Low    Rehab Potential Excellent    PT Frequency 2x / week    PT Duration 8 weeks    PT Treatment/Interventions ADLs/Self Care Home Management;Aquatic Therapy;Biofeedback;Canalith Repostioning;Cryotherapy;Electrical Stimulation;Iontophoresis 4mg /ml Dexamethasone;Moist Heat;Traction;Ultrasound;DME Instruction;Gait training;Functional mobility training;Therapeutic activities;Therapeutic exercise;Balance training;Neuromuscular re-education;Cognitive remediation;Patient/family  education;Manual techniques;Passive range of motion;Dry needling;Splinting;Taping;Vestibular;Joint Manipulations    PT Next Visit Plan Have pt complete LEFS (for ankle), Assess L shoulder pain (Dx frozon shoulder per pt)    PT Home Exercise Plan Access Code: [email or web address]    Consulted and Agree with Plan of Care Patient           Patient will benefit from skilled therapeutic intervention in order to improve the following deficits and impairments:  Pain  Visit Diagnosis: Pain in left ankle and joints of left foot - Plan: PT plan of care cert/re-cert     Problem List Patient Active Problem List   Diagnosis Date Noted  . GERD with esophagitis 09/16/2019  . Chest wall discomfort 11/29/2018  . Neck discomfort 11/29/2018  . Fibroid uterus 02/02/2018  . Hypothyroidism 12/27/2017   Phillips Grout PT, DPT, GCS  Ayvin Lipinski 02/29/2020, 4:58 PM  [facility] [address] [place], [place], [address] Phone: [phone]   Fax:  [phone]  Name: Myesha Stillion MRN: [id number] Date of Birth: 1966/01/28

## 2020-03-07 ENCOUNTER — Ambulatory Visit: Payer: No Typology Code available for payment source | Admitting: Physical Therapy

## 2020-03-07 ENCOUNTER — Ambulatory Visit: Payer: No Typology Code available for payment source

## 2020-03-07 ENCOUNTER — Other Ambulatory Visit: Payer: Self-pay

## 2020-03-07 DIAGNOSIS — M25612 Stiffness of left shoulder, not elsewhere classified: Secondary | ICD-10-CM

## 2020-03-07 DIAGNOSIS — M25512 Pain in left shoulder: Secondary | ICD-10-CM

## 2020-03-07 DIAGNOSIS — G8929 Other chronic pain: Secondary | ICD-10-CM

## 2020-03-07 DIAGNOSIS — M25572 Pain in left ankle and joints of left foot: Secondary | ICD-10-CM | POA: Diagnosis not present

## 2020-03-07 NOTE — Patient Instructions (Signed)
Access Code: [email or web address] URL: [email or web address] Date: 03/07/2020 Prepared by: Roxana Hires  Exercises Standing Overhead Shoulder External Rotation Stretch with Towel - 3 x daily - 7 x weekly - 3 reps - 45s hold Standing Shoulder External Rotation Stretch in Doorway - 3 x daily - 7 x weekly - 3 reps - 45s hold Shoulder ER Stretch in Abduction - 3 x daily - 7 x weekly - 3 reps - 45s hold Seated Shoulder External Rotation PROM on Table - 3 x daily - 7 x weekly - 3 reps - 45s hold Seated Shoulder Flexion AAROM with Pulley in Front - 2 x daily - 7 x weekly - 2 sets - 10 reps - 5-10s hold Seated Shoulder Abduction AAROM with Pulley at Side - 2 x daily - 7 x weekly - 3 sets - 10 reps - 5-10s hold  Patient Education Frozen Shoulder

## 2020-03-07 NOTE — Therapy (Addendum)
[name] [address]. [place], [place], [address] Phone: [phone]   Fax:  [phone]   Physical Therapy Treatment  Patient Details  Name: Adrienne Joyce MRN: [id number] Date of Birth: 07-19-1965 Referring Provider (PT): Marylee Floras   Encounter Date: 03/07/2020   PT End of Session - 03/07/20 1247    Visit Number 2    Number of Visits 17    Date for PT Re-Evaluation 04/25/20    Authorization Type eval: 02/29/20 (ankle), 03/07/20 (shoulder)    PT Start Time 1015    PT Stop Time 1100    PT Time Calculation (min) 45 min    Activity Tolerance Patient tolerated treatment well    Behavior During Therapy [facility] for tasks assessed/performed           Past Medical History:  Diagnosis Date  . Fibroid   . GERD (gastroesophageal reflux disease)   . Thyroid disease     Past Surgical History:  Procedure Laterality Date  . HYSTEROSCOPY  2015  . NASAL POLYP EXCISION  2012   with septoplasty    There were no vitals filed for this visit.   Subjective Assessment - 03/07/20 1019    Subjective Bilateral shoulder adhesive capsulitis, L worse than R.    Pertinent History L ankle inversion sprain 02/09/20 after falling in a hole. Initial radiographs were negative for fracture. She was put in walking boot for 5 weeks. Followed-up with podiatry and he performed repeat radiographs with no signs of fracture. She was discharged from his care with instructions to follow-up as needed. Advised to avoid Zumba and stand up paddle boarding until she is ambulating without any pain. She is still having some pain in anterolateral aspect of the ankle occasionally. She does still have some swelling if she is on her feet for an extended period of time. She is not currently wrapping her ankle or using any compression stockings. Initially having some pain near the base of her fifth metatarsal to palpation but now only occurs with passive or active L  ankle inversion; 03/07/20: L shoulder limited range of motion since July 2021. Pt reports that R shoulder has started to be limited over the last couple months. No trauma or history of shoulder surgery. Pt denies any prior issue with frozen shoulder or pain. Pt is not a diabetic and her last A1c was not concerning for impaired glucose tolerance. She does have a history of thyroid disease. Pt has been performing wall walks for flexion and abduction to help preserve range of motion since symptoms started.    Limitations Walking    Diagnostic tests See history    Patient Stated Goals Decrease pain and return to full function    Currently in Pain? Yes    Pain Score 0-No pain   L shoulder: worst: 9/10, R shoulder worst: 4/10   Pain Location Shoulder    Pain Orientation Right;Left    Pain Descriptors / Indicators Aching    Pain Type Chronic pain    Pain Radiating Towards None    Pain Onset More than a month ago    Pain Frequency Intermittent    Aggravating Factors  end of range of motion flexion and abduction overhead;    Pain Relieving Factors Assisted lowering with contralateral UE              [facility] PT Assessment - 03/08/20 1023      Assessment   Medical Diagnosis Bilateral shoulder  adhesive capsulitis    Referring Provider (PT) Marylee Floras    Onset Date/Surgical Date 10/07/19   Approximate   Hand Dominance Right    Prior Therapy None for shoulders      Precautions   Precautions None      Restrictions   Weight Bearing Restrictions No      Balance Screen   Has the patient fallen in the past 6 months Yes    How many times? 1    Has the patient had a decrease in activity level because of a fear of falling?  No    Is the patient reluctant to leave their home because of a fear of falling?  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 Pt reports that R shoulder has started to be limited over the last couple months. No trauma or history of shoulder surgery. Pt denies any prior issue with frozen shoulder or pain. Pt is not a diabetic and her last A1c was not concerning for impaired glucose tolerance. She does have a history of thyroid disease. Pt has been performing wall walks for flexion and abduction to help preserve range of motions since symptoms started.  Shoulder trauma: No  Pain quality: achy  Pain: L 0/10 Present, 0/10 Best, 9/10 Worst; L: 0/10 Present, Best: 0/10, Worst: 4/10; Aggravating factors: end of range of motion flexion and abduction overhead; Easing factors: Assisted lowering with other UE 24 hour pain behavior: No change, doesn't wake her up at night Radiating pain: No Numbness/Tingling: No Prior history of shoulder injury or pain: No  Prior history of therapy for shoulder: No Follow-up appointment with MD: No Dominant hand: Right C5-C6 disc bulge with intermittent tingling down RUE once every couple months  OBJECTIVE  MUSCULOSKELETAL: Tremor: Normal Bulk: Normal Tone: Normal  Cervical Screen AROM: WFL and painless with overpressure in all planes Spurlings A (ipsilateral lateral flexion/axial compression): R: Not examined L: Not examined Spurlings B (ipsilateral lateral flexion/contralateral rotation/axial compression): R: Not examined L: Not examined Repeated movement: No centralization or peripheralization with protraction or retraction Hoffman Sign (cervical cord compression): R: Not examined L: Not examined ULTT Median: R: Not examined L: Not examined ULTT Ulnar: R: Not examined L: Not examined ULTT Radial: R: Not examined L: Not examined  Elbow  Screen Elbow AROM: Within Normal Limits  Palpation Pt reports mild pain with palpation bilaterally to infrascapular fossa mostly distal near humeral head. Otherwise no significant tenderness noted to palpation;  Strength R/L 5/5 Shoulder flexion (anterior deltoid/pec major/coracobrachialis, axillary n. (C5-6) and musculocutaneous n. (C5-7)) 5/5 Shoulder abduction (deltoid/supraspinatus, axillary/suprascapular n, C5) 5/5 Shoulder external rotation (infraspinatus/teres minor) 5/5 Shoulder internal rotation (subcapularis/lats/pec major) 4+/4+ Shoulder extension (posterior deltoid, lats, teres major, axillary/thoracodorsal n.) *Indicates pain   AROM R/L 175/160 Shoulder flexion* 160/115 Shoulder abduction* 90/75 Shoulder external rotation* 70/75 Shoulder internal rotation HBH: T3 BUE HBB: T3 BUE *Indicates pain, overpressure performed unless otherwise indicated AROM = PROM   Accessory Motions/Glides Glenohumeral: Posterior: R: normal L: abnormal Inferior: R:  normal L: abnormal Anterior: R: not examined L: not examined  Acromioclavicular:  Posterior: R: normal L: abnormal Anterior: R: normal L: abnormal  Sternoclavicular: Posterior: R: normal L: normal Anterior: R: normal L: normal Superior: R: normal L: normal Inferior: R: normal L: normal  Scapulothoracic: Distraction: R: normal L: normal Medial: R: normal L: normal Lateral: R: normal L: normal Inferior: R: normal L: normal Superior: R: normal L: normal  Muscle Length Testing Deferred  NEUROLOGICAL:  Mental Status Patient is oriented to person, place and time.  Recent memory is intact.  Remote memory is intact.  Attention span and concentration are intact.  Expressive speech is intact.  Patient's fund of knowledge is within normal limits for educational level.  Cranial Nerves Deferred  Sensation Grossly intact to light touch bilateral UE as determined by testing dermatomes C2-T2 Proprioception and  hot/cold testing deferred on this date  Reflexes Deferred  SPECIAL TESTS  Rotator Cuff (Bilateral) Drop Arm Test: Negative Painful Arc (Pain from 60 to 120 degrees scaption): Negative Infraspinatus Muscle Test: Negative If all 3 tests positive, the probability of a full-thickness rotator cuff tear is 91%  Subacromial Impingement  Right: Hawkins-Kennedy: Negative Neer (Block scapula, PROM flexion): Positive Painful Arc (Pain from 60 to 120 degrees scaption): Negative Empty Can: Positive External Rotation Resistance: Negative Horizontal Adduction: Negative Scapular Assist: Positive  Left: Hawkins-Kennedy: Negative Neer (Block scapula, PROM flexion): Positive Painful Arc (Pain from 60 to 120 degrees scaption): Negative Empty Can: Positive External Rotation Resistance: Negative Horizontal Adduction: Negative Scapular Assist: Positive  Labral Tear Biceps Load II (120 elevation, full ER, 90 elbow flexion, full supination, resisted elbow flexion): Not examined Crank (160 scaption, axial load with IR/ER): Not examined Active Compression Test: Not examined  Bicep Tendon Pathology Speed (shoulder flexion to 90, external rotation, full elbow extension, and forearm supination with resistance: Not examined Yergason's (resisted shoulder ER and supination/biceps tendon pathology): Not examined  Shoulder Instability Sulcus Sign: Negative Anterior Apprehension: Not examined          PT Education - 03/07/20 1246    Education Details Plan of care and HEP    Person(s) Educated Patient    Methods Explanation;Demonstration;Handout    Comprehension Verbalized understanding            PT Short Term Goals - 03/07/20 1250      PT SHORT TERM GOAL #1   Title Pt will be independent with HEP in order to decrease ankle pain and increase pain-free strength/stability in order to improve pain-free function at home and work.    Time 4    Period Weeks    Status New    Target Date  03/28/20      PT SHORT TERM GOAL #2   Title Pt will be independent with HEP in order to decrease shoulder pain and increase pain-free strength and range of motion in order to improve pain-free function at home and work.    Time 4    Period Weeks    Status New    Target Date 03/28/20             PT Long Term Goals - 03/07/20 1251      PT LONG TERM GOAL #1   Title Pt will decrease worst pain as reported on NPRS by at least 3 points in order to demonstrate clinically significant reduction in ankle/foot pain.    Baseline 02/29/20: worst:8/10    Time 8    Period Weeks    Status New  Target Date 04/25/20      PT LONG TERM GOAL #2   Title Pt will improve FOTO score to at least 79 in order to demonstrate clinically significant improvement in her function related to her L ankle    Baseline 02/29/20: 71    Time 8    Period Weeks    Status New    Target Date 04/25/20      PT LONG TERM GOAL #3   Title Pt will increase LEFS by at least 9 points in order to demonstrate significant improvement in lower extremity function.    Baseline 02/29/20: to be completed at next session    Time 8    Period Weeks    Status New    Target Date 04/25/20      PT LONG TERM GOAL #4   Title Pt will decrease worst shoulder pain as reported on NPRS by at least 3 points in order to demonstrate clinically significant reduction in pain.    Baseline 03/07/20: L: worst 9/10, R: worst 4/10    Time 8    Period Weeks    Status New    Target Date 04/25/20      PT LONG TERM GOAL #5   Title Pt will improve L shoulder AROM flexion, abduction, and external rotation to within 5 degrees of R shoulder in order to improve pain-free funciton at home and work especially overhead reaching    Baseline 03/07/20: AROM R/L: flexion: 175/160, abduction 160/115, ER: 90/75    Time 8    Period Weeks    Status New    Target Date 04/25/20      Additional Long Term Goals   Additional Long Term Goals Yes      PT LONG TERM  GOAL #6   Title Pt will improve shoulder FOTO score to at least 71 in order to demonstrate significant improvement in shoulder function    Baseline 03/07/20: 59    Time 8    Period Weeks    Status New    Target Date 04/25/20                 Plan - 03/07/20 1058    Clinical Impression Statement Pt is a pleasant year-old referred for bilateral shoulder adhesive capsulitis. PT examination reveals deficits primarily in L shoulder range of motion and pain. She is most notably lacking L shoulder abduction, external rotation, and flexion with end range pain with all these positions. Right shoulder AROM is grossly WNL however she has pain also at end range flexion, abduction, and external rotation. All muscle testing is strong and painless bilaterally. Pt does have some signs of subacromial impingement bilaterally. In addition her L posterior glenohumeral capsule feels quite tight with a mild resting anterior position of her L humeral head. Pt will benefit from PT services to address deficits in bilateral shoulder mobility and pain in order to return to full pain-free function at home and work.    Personal Factors and Comorbidities Age;Comorbidity 1    Comorbidities thyroid disease    Examination-Activity Limitations Lift;Reach Overhead    Examination-Participation Restrictions Occupation;Community Activity    Stability/Clinical Decision Making Stable/Uncomplicated    Clinical Decision Making Low    Rehab Potential Excellent    PT Frequency 2x / week    PT Duration 8 weeks    PT Treatment/Interventions ADLs/Self Care Home Management;Aquatic Therapy;Biofeedback;Canalith Repostioning;Cryotherapy;Electrical Stimulation;Iontophoresis 4mg /ml Dexamethasone;Moist Heat;Traction;Ultrasound;DME Instruction;Gait training;Functional mobility training;Therapeutic activities;Therapeutic exercise;Balance training;Neuromuscular re-education;Cognitive remediation;Patient/family education;Manual techniques;Passive  range  of motion;Dry needling;Splinting;Taping;Vestibular;Joint Manipulations;Other (comment)   phonophoresis with 1% diclofenac gel   PT Next Visit Plan Have pt complete LEFS (for ankle), Have pt complete quickDASH    PT Home Exercise Plan Access Code: [email or web address] (ankle); Access Code: [email or web address] (shoulder)    Consulted and Agree with Plan of Care Patient           Patient will benefit from skilled therapeutic intervention in order to improve the following deficits and impairments:  Pain, Hypomobility  Visit Diagnosis: Chronic left shoulder pain  Chronic right shoulder pain  Stiffness of left shoulder, not elsewhere classified     Problem List Patient Active Problem List   Diagnosis Date Noted  . GERD with esophagitis 09/16/2019  . Chest wall discomfort 11/29/2018  . Neck discomfort 11/29/2018  . Fibroid uterus 02/02/2018  . Hypothyroidism 12/27/2017   Phillips Grout PT, DPT, GCS  Kadey Mihalic 03/08/2020, 10:39 AM  [facility] [address] [place], [place], [address] Phone: [phone]   Fax:  [phone]  Name: Kayela Humphres MRN: [id number] Date of Birth: 11-22-1965

## 2020-03-08 NOTE — Addendum Note (Signed)
Addended by: Roxana Hires D on: 03/08/2020 10:40 AM   Modules accepted: Orders

## 2020-03-10 ENCOUNTER — Institutional Professional Consult (permissible substitution): Payer: No Typology Code available for payment source | Admitting: Pulmonary Disease

## 2020-03-15 ENCOUNTER — Encounter: Payer: No Typology Code available for payment source | Admitting: Physical Therapy

## 2020-03-16 ENCOUNTER — Ambulatory Visit: Payer: No Typology Code available for payment source

## 2020-03-20 ENCOUNTER — Ambulatory Visit: Payer: No Typology Code available for payment source

## 2020-03-21 ENCOUNTER — Encounter: Payer: Self-pay | Admitting: Internal Medicine

## 2020-03-22 ENCOUNTER — Other Ambulatory Visit: Payer: Self-pay | Admitting: Internal Medicine

## 2020-03-22 ENCOUNTER — Ambulatory Visit: Payer: No Typology Code available for payment source | Attending: Podiatry

## 2020-03-22 ENCOUNTER — Other Ambulatory Visit: Payer: Self-pay

## 2020-03-22 DIAGNOSIS — M25511 Pain in right shoulder: Secondary | ICD-10-CM | POA: Diagnosis present

## 2020-03-22 DIAGNOSIS — M25512 Pain in left shoulder: Secondary | ICD-10-CM | POA: Diagnosis present

## 2020-03-22 DIAGNOSIS — G8929 Other chronic pain: Secondary | ICD-10-CM | POA: Diagnosis present

## 2020-03-22 DIAGNOSIS — M25612 Stiffness of left shoulder, not elsewhere classified: Secondary | ICD-10-CM | POA: Insufficient documentation

## 2020-03-22 DIAGNOSIS — E039 Hypothyroidism, unspecified: Secondary | ICD-10-CM

## 2020-03-22 NOTE — Therapy (Signed)
[facility] [address] [place], [place], [address] Phone: [phone]   Fax:  [phone]  Physical Therapy Treatment  Patient Details  Name: Adrienne Joyce MRN: [id number] Date of Birth: [birth year] Referring Provider (PT): Marylee Floras   Encounter Date: 03/22/2020   PT End of Session - 03/22/20 1323    Visit Number 3    Number of Visits 17    Date for PT Re-Evaluation 04/25/20    Authorization Type eval: 02/29/20 (ankle), 03/07/20 (shoulder)    PT Start Time 1319    PT Stop Time 1345    PT Time Calculation (min) 26 min    Activity Tolerance Patient tolerated treatment well    Behavior During Therapy [facility] for tasks assessed/performed           Past Medical History:  Diagnosis Date  . Fibroid   . GERD (gastroesophageal reflux disease)   . Thyroid disease     Past Surgical History:  Procedure Laterality Date  . HYSTEROSCOPY  2015  . NASAL POLYP EXCISION  2012   with septoplasty    There were no vitals filed for this visit.   Subjective Assessment - 03/22/20 1322    Subjective Pt reports that she is doing alright today. She states that she is getting more movement in her L shoulder. She has had some minimal L ankle pain when doing Zumba but otherwise believes that it is progressing well. No specific questions or concerns upon arrival.    Pertinent History L ankle inversion sprain 02/09/20 after falling in a hole. Initial radiographs were negative for fracture. She was put in walking boot for 5 weeks. Followed-up with podiatry and he performed repeat radiographs with no signs of fracture. She was discharged from his care with instructions to follow-up as needed. Advised to avoid Zumba and stand up paddle boarding until she is ambulating without any pain. She is still having some pain in anterolateral aspect of the ankle occasionally. She does still have some swelling if she is on her feet for an extended period of  time. She is not currently wrapping her ankle or using any compression stockings. Initially having some pain near the base of her fifth metatarsal to palpation but now only occurs with passive or active L ankle inversion; 03/07/20: L shoulder limited range of motion since July 2021. Pt reports that R shoulder has started to be limited over the last couple months. No trauma or history of shoulder surgery. Pt denies any prior issue with frozen shoulder or pain. Pt is not a diabetic and her last A1c was not concerning for impaired glucose tolerance. She does have a history of thyroid disease. Pt has been performing wall walks for flexion and abduction to help preserve range of motion since symptoms started.    Limitations Walking    Diagnostic tests See history    Patient Stated Goals Decrease pain and return to full function    Currently in Pain? No/denies              TREATMENT   Manual Therapy  Bilateral shoulder anterior to posterioinferior mobilizations at available end range flexion, grade III, 20s/bout x 3 bouts each; Bilateral shoulder inferior mobilizations at 90-120 abduction, grade III, 20s/bout x 3 bouts each; Bilateral shoulder distraction mobilizations progressing through PROM abduction x multiple bouts on each side; L shoulder A/P mobilizations at 90 flexion pushing through long axis of humerus, grade III, 20s/bout x 3 bouts; L shoulder  A/P mobilizations at 90 abduction and end range external rotation, grade III, 20s/bout x 3 bouts;   Ther-ex  Supine serratus punch with manual resistance from therapist x 10 BUE;   Pt educated throughout session about proper posture and technique with exercises. Improved exercise technique, movement at target joints, use of target muscles after min to mod verbal, visual, tactile cues.    Patient demonstrates significantly improved left shoulder range of motion at beginning of session, and even more improved following manual therapy.  She  denies any pain during joint mobilizations or supine serratus punches.  Due to limited time unable to progress into more strengthening and manual techniques however at next session will consider trigger point dry needling and additional mobilizations to continue improving range of motion as well as motor control. Pt will benefit from PT services to address deficits in shoulder range of motion, strength, and pain in order to return to full function at home and work without pain.                        PT Short Term Goals - 03/07/20 1250      PT SHORT TERM GOAL #1   Title Pt will be independent with HEP in order to decrease ankle pain and increase pain-free strength/stability in order to improve pain-free function at home and work.    Time 4    Period Weeks    Status New    Target Date 03/28/20      PT SHORT TERM GOAL #2   Title Pt will be independent with HEP in order to decrease shoulder pain and increase pain-free strength and range of motion in order to improve pain-free function at home and work.    Time 4    Period Weeks    Status New    Target Date 03/28/20             PT Long Term Goals - 03/07/20 1251      PT LONG TERM GOAL #1   Title Pt will decrease worst pain as reported on NPRS by at least 3 points in order to demonstrate clinically significant reduction in ankle/foot pain.    Baseline 02/29/20: worst:8/10    Time 8    Period Weeks    Status New    Target Date 04/25/20      PT LONG TERM GOAL #2   Title Pt will improve FOTO score to at least 79 in order to demonstrate clinically significant improvement in her function related to her L ankle    Baseline 02/29/20: 71    Time 8    Period Weeks    Status New    Target Date 04/25/20      PT LONG TERM GOAL #3   Title Pt will increase LEFS by at least 9 points in order to demonstrate significant improvement in lower extremity function.    Baseline 02/29/20: to be completed at next session    Time 8     Period Weeks    Status New    Target Date 04/25/20      PT LONG TERM GOAL #4   Title Pt will decrease worst shoulder pain as reported on NPRS by at least 3 points in order to demonstrate clinically significant reduction in pain.    Baseline 03/07/20: L: worst 9/10, R: worst 4/10    Time 8    Period Weeks    Status New    Target  Date 04/25/20      PT LONG TERM GOAL #5   Title Pt will improve L shoulder AROM flexion, abduction, and external rotation to within 5 degrees of R shoulder in order to improve pain-free funciton at home and work especially overhead reaching    Baseline 03/07/20: AROM R/L: flexion: 175/160, abduction 160/115, ER: 90/75    Time 8    Period Weeks    Status New    Target Date 04/25/20      Additional Long Term Goals   Additional Long Term Goals Yes      PT LONG TERM GOAL #6   Title Pt will improve shoulder FOTO score to at least 71 in order to demonstrate significant improvement in shoulder function    Baseline 03/07/20: 59    Time 8    Period Weeks    Status New    Target Date 04/25/20                 Plan - 03/22/20 1541    Clinical Impression Statement Patient demonstrates significantly improved left shoulder range of motion at beginning of session, and even more improved following manual therapy.  She denies any pain during joint mobilizations or supine serratus punches.  Due to limited time unable to progress into more strengthening and manual techniques however at next session will consider trigger point dry needling and additional mobilizations to continue improving range of motion as well as motor control. Pt will benefit from PT services to address deficits in shoulder range of motion, strength, and pain in order to return to full function at home and work without pain.    Personal Factors and Comorbidities Age;Comorbidity 1    Comorbidities thyroid disease    Examination-Activity Limitations Lift;Reach Overhead    Examination-Participation  Restrictions Occupation;Community Activity    Stability/Clinical Decision Making Stable/Uncomplicated    Rehab Potential Excellent    PT Frequency 2x / week    PT Duration 8 weeks    PT Treatment/Interventions ADLs/Self Care Home Management;Aquatic Therapy;Biofeedback;Canalith Repostioning;Cryotherapy;Electrical Stimulation;Iontophoresis 4mg /ml Dexamethasone;Moist Heat;Traction;Ultrasound;DME Instruction;Gait training;Functional mobility training;Therapeutic activities;Therapeutic exercise;Balance training;Neuromuscular re-education;Cognitive remediation;Patient/family education;Manual techniques;Passive range of motion;Dry needling;Splinting;Taping;Vestibular;Joint Manipulations;Other (comment)   phonophoresis with 1% diclofenac gel   PT Next Visit Plan Continue with shoulder mobilizations, consider TDN, progress strengthening as needed    PT Home Exercise Plan Access Code: [email or web address] (ankle); Access Code: [email or web address] (shoulder)    Consulted and Agree with Plan of Care Patient           Patient will benefit from skilled therapeutic intervention in order to improve the following deficits and impairments:  Pain,Hypomobility  Visit Diagnosis: Chronic left shoulder pain  Chronic right shoulder pain  Stiffness of left shoulder, not elsewhere classified     Problem List Patient Active Problem List   Diagnosis Date Noted  . GERD with esophagitis 09/16/2019  . Chest wall discomfort 11/29/2018  . Neck discomfort 11/29/2018  . Fibroid uterus 02/02/2018  . Hypothyroidism 12/27/2017   Phillips Grout PT, DPT, GCS  Aira Sallade 03/22/2020, 4:34 PM  [facility] [address] [place], [place], [address] Phone: [phone]   Fax:  [phone]  Name: Adrienne Joyce MRN: [id number] Date of Birth: Jan 07, 1966

## 2020-03-27 ENCOUNTER — Ambulatory Visit (INDEPENDENT_AMBULATORY_CARE_PROVIDER_SITE_OTHER): Payer: No Typology Code available for payment source | Admitting: Internal Medicine

## 2020-03-27 ENCOUNTER — Telehealth: Payer: No Typology Code available for payment source | Admitting: Internal Medicine

## 2020-03-27 ENCOUNTER — Other Ambulatory Visit: Payer: Self-pay

## 2020-03-27 ENCOUNTER — Other Ambulatory Visit: Payer: Self-pay | Admitting: Internal Medicine

## 2020-03-27 ENCOUNTER — Encounter: Payer: Self-pay | Admitting: Internal Medicine

## 2020-03-27 VITALS — BP 124/76 | HR 86 | Temp 98.0°F | Ht 62.6 in | Wt 162.0 lb

## 2020-03-27 DIAGNOSIS — E039 Hypothyroidism, unspecified: Secondary | ICD-10-CM | POA: Diagnosis not present

## 2020-03-27 DIAGNOSIS — Z23 Encounter for immunization: Secondary | ICD-10-CM

## 2020-03-27 DIAGNOSIS — Z6829 Body mass index (BMI) 29.0-29.9, adult: Secondary | ICD-10-CM

## 2020-03-27 DIAGNOSIS — Z1159 Encounter for screening for other viral diseases: Secondary | ICD-10-CM

## 2020-03-27 DIAGNOSIS — Z8616 Personal history of COVID-19: Secondary | ICD-10-CM

## 2020-03-27 DIAGNOSIS — E559 Vitamin D deficiency, unspecified: Secondary | ICD-10-CM | POA: Diagnosis not present

## 2020-03-27 DIAGNOSIS — E663 Overweight: Secondary | ICD-10-CM

## 2020-03-27 MED ORDER — PHENTERMINE HCL 15 MG PO CAPS: 15.0000 mg | ORAL_CAPSULE | ORAL | 0 refills | Status: DC

## 2020-03-27 MED FILL — PHENTERMINE HCL 15 MG CAPS: 15 | 30 days supply | Qty: 30 | Fill #0

## 2020-03-27 NOTE — Patient Instructions (Addendum)

## 2020-03-27 NOTE — Progress Notes (Signed)
I,Tianna Badgett,acting as a Education administrator for Maximino Greenland, MD.,have documented all relevant documentation on the behalf of Maximino Greenland, MD,as directed by  Maximino Greenland, MD while in the presence of Maximino Greenland, MD.  This visit occurred during the SARS-CoV-2 public health emergency.  Safety protocols were in place, including screening questions prior to the visit, additional usage of staff PPE, and extensive cleaning of exam room while observing appropriate contact time as indicated for disinfecting solutions.  Subjective:     Patient ID: Adrienne Joyce , female    DOB: 09/01/1965 , 54 y.o.   MRN: [id number]   Chief Complaint  Patient presents with  . Obesity    HPI  She presents today for weight check. She admits to having some weight gain since having COVID. She would like to resume phentermine. She feels this would jumpstart her weight loss efforts. She feels her energy has returned.     Past Medical History:  Diagnosis Date  . Fibroid   . GERD (gastroesophageal reflux disease)   . Thyroid disease      Family History  Problem Relation Age of Onset  . Colon cancer Mother 6       carcinoid tumor  . Acromegaly Mother   . Diabetes Father   . Hypertension Father   . Alcohol abuse Father   . Hyperlipidemia Father      Current Outpatient Medications:  Marland Kitchen  Magnesium 200 MG TABS, , Disp: , Rfl:  .  Multiple Vitamin (MULTIVITAMIN) tablet, Take 1 tablet by mouth daily., Disp: , Rfl:  .  phentermine 15 MG capsule, Take 1 capsule (15 mg total) by mouth every morning., Disp: 30 capsule, Rfl: 0 .  PROGESTERONE PO, Take by mouth., Disp: , Rfl:  .  thyroid (ARMOUR THYROID) 30 MG tablet, Take 1 tablet (30 mg total) by mouth daily before breakfast. Take Monday through Friday ONLY, skip the weekends (Patient taking differently: Take 30 mg by mouth daily before breakfast. Monday Tuesday Thursday and Friday), Disp: 90 tablet, Rfl: 1 .  thyroid (ARMOUR THYROID) 60 MG tablet,  Take 1 tablet (60 mg total) by mouth daily. (Patient taking differently: Take 60 mg by mouth daily. Wednesday Saturday and sunday), Disp: 90 tablet, Rfl: 1   Allergies  Allergen Reactions  . Morphine And Related Shortness Of Breath  . Morpholine Salicylate Shortness Of Breath  . Macrobid [Nitrofurantoin]   . Latex Itching and Rash     Review of Systems  Constitutional: Negative.   Respiratory: Negative.   Cardiovascular: Negative.   Gastrointestinal: Negative.   Neurological: Negative.      Today's Vitals   03/27/20 1536  BP: 124/76  Pulse: 86  Temp: 98 F (36.7 C)  TempSrc: Oral  Weight: 162 lb (73.5 kg)  Height: 5' 2.6" (1.59 m)   Body mass index is 29.06 kg/m.  Wt Readings from Last 3 Encounters:  03/27/20 162 lb (73.5 kg)  11/23/19 147 lb (66.7 kg)  09/16/19 153 lb (69.4 kg)     Objective:  Physical Exam Vitals and nursing note reviewed.  Constitutional:      Appearance: Normal appearance.  HENT:     Head: Normocephalic and atraumatic.  Cardiovascular:     Rate and Rhythm: Normal rate and regular rhythm.     Heart sounds: Normal heart sounds.  Pulmonary:     Effort: Pulmonary effort is normal.     Breath sounds: Normal breath sounds.  Skin:    General:  Skin is warm.  Neurological:     General: No focal deficit present.     Mental Status: She is alert.  Psychiatric:        Mood and Affect: Mood normal.        Behavior: Behavior normal.         Assessment And Plan:     1. Overweight with body mass index (BMI) of 29 to 29.9 in adult Comments: She has gained 15 pounds since her last visit. She would like to resume phentermine to jumpstart her weight loss. She has taken this in the past w/o AE.   2. Primary hypothyroidism Comments: I will check thyroid panel and adjust meds as needed. I will also refer her to [facility] as requested. She plans to transfer her primary care to this practice.  - T3, free - T4, free - TSH -  Ambulatory referral to Internal Medicine  3. Vitamin D deficiency disease Comments: I will check vitamin D level and supplement as needed.  - Vitamin D (25 hydroxy)  4. Encounter for HCV screening test for low risk patient Comments: I will check HCV antibody.  - Hepatitis C antibody  5. Immunization due Comments: She reports having medical exception to getting COVID vaccine.   Patient was given opportunity to ask questions. Patient verbalized understanding of the plan and was able to repeat key elements of the plan. All questions were answered to their satisfaction.  Maximino Greenland, MD   I, Maximino Greenland, MD, have reviewed all documentation for this visit. The documentation on 04/24/20 for the exam, diagnosis, procedures, and orders are all accurate and complete.  THE PATIENT IS ENCOURAGED TO PRACTICE SOCIAL DISTANCING DUE TO THE COVID-19 PANDEMIC.

## 2020-03-28 ENCOUNTER — Encounter: Payer: Self-pay | Admitting: Internal Medicine

## 2020-03-28 ENCOUNTER — Ambulatory Visit: Payer: No Typology Code available for payment source

## 2020-03-28 ENCOUNTER — Encounter: Payer: No Typology Code available for payment source | Admitting: Physical Therapy

## 2020-03-28 DIAGNOSIS — M25612 Stiffness of left shoulder, not elsewhere classified: Secondary | ICD-10-CM

## 2020-03-28 DIAGNOSIS — M25512 Pain in left shoulder: Secondary | ICD-10-CM

## 2020-03-28 DIAGNOSIS — G8929 Other chronic pain: Secondary | ICD-10-CM

## 2020-03-28 LAB — VITAMIN D 25 HYDROXY (VIT D DEFICIENCY, FRACTURES): Vit D, 25-Hydroxy: 32 ng/mL (ref 30.0–100.0)

## 2020-03-28 LAB — T4, FREE: Free T4: 0.86 ng/dL (ref 0.82–1.77)

## 2020-03-28 LAB — T3, FREE: T3, Free: 3 pg/mL (ref 2.0–4.4)

## 2020-03-28 LAB — TSH: TSH: 3.26 u[IU]/mL (ref 0.450–4.500)

## 2020-03-28 LAB — HEPATITIS C ANTIBODY: Hep C Virus Ab: <0.1 s/co ratio (ref 0.0–0.9)

## 2020-03-28 NOTE — Therapy (Signed)
[facility] [address] [place], [place], [address] Phone: [phone]   Fax:  [phone]  Physical Therapy Treatment  Patient Details  Name: Adrienne Joyce MRN: [id number] Date of Birth: 17-Nov-1965 Referring Provider (PT): Marylee Floras   Encounter Date: 03/28/2020   PT End of Session - 03/28/20 0854    Visit Number 4    Number of Visits 17    Date for PT Re-Evaluation 04/25/20    Authorization Type eval: 02/29/20 (ankle), 03/07/20 (shoulder)    PT Start Time 0850    PT Stop Time 0930    PT Time Calculation (min) 40 min    Activity Tolerance Patient tolerated treatment well    Behavior During Therapy [facility] for tasks assessed/performed           Past Medical History:  Diagnosis Date   Fibroid    GERD (gastroesophageal reflux disease)    Thyroid disease     Past Surgical History:  Procedure Laterality Date   HYSTEROSCOPY  2015   NASAL POLYP EXCISION  2012   with septoplasty    There were no vitals filed for this visit.   Subjective Assessment - 03/28/20 0851    Subjective Pt reports that she is doing alright today.  She reports her ankle continues to improve and the did not have any pain with Zumba this week.  She does report some pain with maximal left ankle plantarflexion in the anterior lateral aspect of her ankle over her ATF ligament.  She reports continued progress with her shoulder stretches and continues to have pain only at end range flexion abduction.  No specific questions or concerns at this time.    Pertinent History L ankle inversion sprain 02/09/20 after falling in a hole. Initial radiographs were negative for fracture. She was put in walking boot for 5 weeks. Followed-up with podiatry and he performed repeat radiographs with no signs of fracture. She was discharged from his care with instructions to follow-up as needed. Advised to avoid Zumba and stand up paddle boarding until she is ambulating  without any pain. She is still having some pain in anterolateral aspect of the ankle occasionally. She does still have some swelling if she is on her feet for an extended period of time. She is not currently wrapping her ankle or using any compression stockings. Initially having some pain near the base of her fifth metatarsal to palpation but now only occurs with passive or active L ankle inversion; 03/07/20: L shoulder limited range of motion since July 2021. Pt reports that R shoulder has started to be limited over the last couple months. No trauma or history of shoulder surgery. Pt denies any prior issue with frozen shoulder or pain. Pt is not a diabetic and her last A1c was not concerning for impaired glucose tolerance. She does have a history of thyroid disease. Pt has been performing wall walks for flexion and abduction to help preserve range of motion since symptoms started.    Limitations Walking    Diagnostic tests See history    Patient Stated Goals Decrease pain and return to full function    Currently in Pain? No/denies               TREATMENT   Manual Therapy  Single leg balance on Airex pad without UE support. Pt demonstrates mild instability bilaterally but no worse on LLE compared to RLE; Bilateral shoulder passive range of motion assessment; Bilateral shoulder anterior to  posterioinferior mobilizations at available end range flexion, grade III, 20s/bout x 3 bouts each; Bilateral shoulder distraction mobilizations progressing through PROM abduction x multiple bouts on each side; L shoulder A/P mobilizations at 90 flexion pushing through long axis of humerus, grade III, 20s/bout x 3 bouts; STM to left lat, teres minor/major, and subscapularis.  Multiple tender locations and trigger points noted;   Trigger Point Dry Needling (TDN), unbilled Education performed with patient regarding potential benefit of TDN. Reviewed precautions and risks with patient. Reviewed special  precautions/risks over lung fields which include pneumothorax. Reviewed signs and symptoms of pneumothorax and advised pt to go to ER immediately if these symptoms develop advise them of dry needling treatment. Extensive time spent with pt to ensure full understanding of TDN risks.  Clean technique utilized cleaning skin with alcohol prior to each placement and therapist donning gloves. Pt provided verbal consent to treatment. TDN performed to L upper trap, L infraspinatus, and L lat with 3, 0.25 x 60 single needle placements (one in each location) with local twitch response (LTR) during placement in upper trap. Pistoning technique utilized. Mild improvement in pain-free motion following intervention.     Pt educated throughout session about proper posture and technique with exercises. Improved exercise technique, movement at target joints, use of target muscles after min to mod verbal, visual, tactile cues.    Patient demonstrates excellent carryover from last session with respect to her shoulder range of motion.  She continues to report pain at end range bilateral shoulder flexion and abduction with overpressure.  External and internal rotation looks full bilaterally and is painless.  Continued with glenohumeral mobilizations bilaterally today and also incorporated soft tissue mobilization and trigger point dry needling for left shoulder.  We will continue to progress into additional strengthening at follow-up sessions.  Patient encouraged to continue HEP.  Pt will benefit from PT services to address deficits in shoulder range of motion, strength, and pain in order to return to full function at home and work without pain.                         PT Short Term Goals - 03/07/20 1250      PT SHORT TERM GOAL #1   Title Pt will be independent with HEP in order to decrease ankle pain and increase pain-free strength/stability in order to improve pain-free function at home and work.     Time 4    Period Weeks    Status New    Target Date 03/28/20      PT SHORT TERM GOAL #2   Title Pt will be independent with HEP in order to decrease shoulder pain and increase pain-free strength and range of motion in order to improve pain-free function at home and work.    Time 4    Period Weeks    Status New    Target Date 03/28/20             PT Long Term Goals - 03/07/20 1251      PT LONG TERM GOAL #1   Title Pt will decrease worst pain as reported on NPRS by at least 3 points in order to demonstrate clinically significant reduction in ankle/foot pain.    Baseline 02/29/20: worst:8/10    Time 8    Period Weeks    Status New    Target Date 04/25/20      PT LONG TERM GOAL #2   Title Pt will  improve FOTO score to at least 79 in order to demonstrate clinically significant improvement in her function related to her L ankle    Baseline 02/29/20: 71    Time 8    Period Weeks    Status New    Target Date 04/25/20      PT LONG TERM GOAL #3   Title Pt will increase LEFS by at least 9 points in order to demonstrate significant improvement in lower extremity function.    Baseline 02/29/20: to be completed at next session    Time 8    Period Weeks    Status New    Target Date 04/25/20      PT LONG TERM GOAL #4   Title Pt will decrease worst shoulder pain as reported on NPRS by at least 3 points in order to demonstrate clinically significant reduction in pain.    Baseline 03/07/20: L: worst 9/10, R: worst 4/10    Time 8    Period Weeks    Status New    Target Date 04/25/20      PT LONG TERM GOAL #5   Title Pt will improve L shoulder AROM flexion, abduction, and external rotation to within 5 degrees of R shoulder in order to improve pain-free funciton at home and work especially overhead reaching    Baseline 03/07/20: AROM R/L: flexion: 175/160, abduction 160/115, ER: 90/75    Time 8    Period Weeks    Status New    Target Date 04/25/20      Additional Long Term Goals    Additional Long Term Goals Yes      PT LONG TERM GOAL #6   Title Pt will improve shoulder FOTO score to at least 71 in order to demonstrate significant improvement in shoulder function    Baseline 03/07/20: 59    Time 8    Period Weeks    Status New    Target Date 04/25/20                 Plan - 03/28/20 0855    Clinical Impression Statement Patient demonstrates excellent carryover from last session with respect to her shoulder range of motion.  She continues to report pain at end range bilateral shoulder flexion and abduction with overpressure.  External and internal rotation looks full bilaterally and is painless.  Continued with glenohumeral mobilizations bilaterally today and also incorporated soft tissue mobilization and trigger point dry needling for left shoulder.  We will continue to progress into additional strengthening at follow-up sessions.  Patient encouraged to continue HEP.  Pt will benefit from PT services to address deficits in shoulder range of motion, strength, and pain in order to return to full function at home and work without pain.    Personal Factors and Comorbidities Age;Comorbidity 1    Comorbidities thyroid disease    Examination-Activity Limitations Lift;Reach Overhead    Examination-Participation Restrictions Occupation;Community Activity    Stability/Clinical Decision Making Stable/Uncomplicated    Rehab Potential Excellent    PT Frequency 2x / week    PT Duration 8 weeks    PT Treatment/Interventions ADLs/Self Care Home Management;Aquatic Therapy;Biofeedback;Canalith Repostioning;Cryotherapy;Electrical Stimulation;Iontophoresis 4mg /ml Dexamethasone;Moist Heat;Traction;Ultrasound;DME Instruction;Gait training;Functional mobility training;Therapeutic activities;Therapeutic exercise;Balance training;Neuromuscular re-education;Cognitive remediation;Patient/family education;Manual techniques;Passive range of motion;Dry  needling;Splinting;Taping;Vestibular;Joint Manipulations;Other (comment)   phonophoresis with 1% diclofenac gel   PT Next Visit Plan Continue with shoulder mobilizations, consider TDN, progress strengthening as needed    PT Home Exercise Plan Access Code: [email or web address] (ankle); Access Code:  [email or web address] (shoulder)    Consulted and Agree with Plan of Care Patient           Patient will benefit from skilled therapeutic intervention in order to improve the following deficits and impairments:  Pain,Hypomobility  Visit Diagnosis: Chronic left shoulder pain  Chronic right shoulder pain  Stiffness of left shoulder, not elsewhere classified     Problem List Patient Active Problem List   Diagnosis Date Noted   GERD with esophagitis 09/16/2019   Chest wall discomfort 11/29/2018   Neck discomfort 11/29/2018   Fibroid uterus 02/02/2018   Hypothyroidism 12/27/2017   Lyndel Safe Zackerie Sara PT, DPT, GCS  Ted Leonhart 03/28/2020, 10:44 AM  [facility] [address] [place], [place], [address] Phone: [phone]   Fax:  [phone]  Name: Adrienne Joyce MRN: [id number] Date of Birth: 08/31/1965

## 2020-03-30 ENCOUNTER — Encounter: Payer: No Typology Code available for payment source | Admitting: Physical Therapy

## 2020-03-30 ENCOUNTER — Institutional Professional Consult (permissible substitution): Payer: No Typology Code available for payment source | Admitting: Pulmonary Disease

## 2020-04-02 ENCOUNTER — Encounter: Payer: Self-pay | Admitting: Internal Medicine

## 2020-04-05 ENCOUNTER — Ambulatory Visit: Payer: No Typology Code available for payment source

## 2020-04-10 ENCOUNTER — Encounter: Payer: No Typology Code available for payment source | Admitting: Physical Therapy

## 2020-04-10 NOTE — Patient Instructions (Incomplete)
TREATMENT   Manual Therapy  Single leg balance on Airex pad without UE support. Pt demonstrates mild instability bilaterally but no worse on LLE compared to RLE; Bilateral shoulder passive range of motion assessment; Bilateral shoulder anterior to posterioinferior mobilizations at available end range flexion, grade III, 20s/bout x 3 bouts each; Bilateral shoulder distraction mobilizations progressing through PROM abduction x multiple bouts on each side; L shoulder A/P mobilizations at 90 flexion pushing through long axis of humerus, grade III, 20s/bout x 3 bouts; STM to left lat, teres minor/major, and subscapularis.  Multiple tender locations and trigger points noted;   Trigger Point Dry Needling (TDN), unbilled Education performed with patient regarding potential benefit of TDN. Reviewed precautions and risks with patient. Reviewed special precautions/risks over lung fields which include pneumothorax. Reviewed signs and symptoms of pneumothorax and advised pt to go to ER immediately if these symptoms develop advise them of dry needling treatment. Extensive time spent with pt to ensure full understanding of TDN risks.  Clean technique utilized cleaning skin with alcohol prior to each placement and therapist donning gloves. Pt provided verbal consent to treatment. TDN performed to L upper trap, L infraspinatus, and L lat with 3, 0.25 x 60 single needle placements (one in each location) with local twitch response (LTR) during placement in upper trap. Pistoning technique utilized. Mild improvement in pain-free motion following intervention.     Pt educated throughout session about proper posture and technique with exercises. Improved exercise technique, movement at target joints, use of target muscles after min to mod verbal, visual, tactile cues.    Patient demonstrates excellent carryover from last session with respect to her shoulder range of motion.  She continues to report pain at end  range bilateral shoulder flexion and abduction with overpressure.  External and internal rotation looks full bilaterally and is painless.  Continued with glenohumeral mobilizations bilaterally today and also incorporated soft tissue mobilization and trigger point dry needling for left shoulder.  We will continue to progress into additional strengthening at follow-up sessions.  Patient encouraged to continue HEP.  Pt will benefit from PT services to address deficits in shoulder range of motion, strength, and pain in order to return to full function at home and work without pain.

## 2020-04-12 MED FILL — ARMOUR THYROID 30 MG TABLET: 30 | 73 days supply | Qty: 52 | Fill #2

## 2020-04-13 ENCOUNTER — Other Ambulatory Visit: Payer: Self-pay

## 2020-04-13 ENCOUNTER — Ambulatory Visit: Payer: No Typology Code available for payment source | Attending: Podiatry

## 2020-04-13 DIAGNOSIS — M25511 Pain in right shoulder: Secondary | ICD-10-CM | POA: Diagnosis present

## 2020-04-13 DIAGNOSIS — G8929 Other chronic pain: Secondary | ICD-10-CM | POA: Insufficient documentation

## 2020-04-13 DIAGNOSIS — M25612 Stiffness of left shoulder, not elsewhere classified: Secondary | ICD-10-CM | POA: Insufficient documentation

## 2020-04-13 DIAGNOSIS — M25512 Pain in left shoulder: Secondary | ICD-10-CM | POA: Diagnosis not present

## 2020-04-13 NOTE — Patient Instructions (Addendum)
Access Code: [email or web address] URL: [email or web address] Date: 04/13/2020 Prepared by: Ria Comment  Exercises Standing Overhead Shoulder External Rotation Stretch with Towel - 3 x daily - 7 x weekly - 3 reps - 45s hold Standing Shoulder External Rotation Stretch in Doorway - 3 x daily - 7 x weekly - 3 reps - 45s hold Shoulder ER Stretch in Abduction - 3 x daily - 7 x weekly - 3 reps - 45s hold Seated Shoulder External Rotation PROM on Table - 3 x daily - 7 x weekly - 3 reps - 45s hold Seated Shoulder Flexion AAROM with Pulley in Front - 2 x daily - 7 x weekly - 2 sets - 10 reps - 5-10s hold Seated Shoulder Abduction AAROM with Pulley at Side - 2 x daily - 7 x weekly - 3 sets - 10 reps - 5-10s hold Wall Angels - 2 x daily - 7 x weekly - 2 sets - 10 reps - 5-10s hold Shoulder W - External Rotation with Resistance - 2 x daily - 7 x weekly - 2 sets - 10 reps - 3-5s hold  Patient Education Frozen Shoulder

## 2020-04-13 NOTE — Therapy (Signed)
[place] [facility] [address] [place], [place], [address] Phone: [phone]   Fax:  [phone]  Physical Therapy Treatment/Goal Update  Patient Details  Name: Christyl Osentoski MRN: [id number] Date of Birth: [birth year] Referring Provider (PT): Marylee Floras   Encounter Date: 04/13/2020   PT End of Session - 04/13/20 0801    Visit Number 5    Number of Visits 17    Date for PT Re-Evaluation 04/25/20    Authorization Type eval: 02/29/20 (ankle), 03/07/20 (shoulder)    PT Start Time 0800    PT Stop Time 0850    PT Time Calculation (min) 50 min    Activity Tolerance Patient tolerated treatment well    Behavior During Therapy [facility] for tasks assessed/performed           Past Medical History:  Diagnosis Date  . Fibroid   . GERD (gastroesophageal reflux disease)   . Thyroid disease     Past Surgical History:  Procedure Laterality Date  . HYSTEROSCOPY  2015  . NASAL POLYP EXCISION  2012   with septoplasty    There were no vitals filed for this visit.   Subjective Assessment - 04/13/20 0806    Subjective Pt reports that she is doing well today. She states that her ankle feels mostly improved with the exception of some intermittent anterolateral ankle pain with active L ankle inversion. No specific questions or concerns at this time.    Pertinent History L ankle inversion sprain 02/09/20 after falling in a hole. Initial radiographs were negative for fracture. She was put in walking boot for 5 weeks. Followed-up with podiatry and he performed repeat radiographs with no signs of fracture. She was discharged from his care with instructions to follow-up as needed. Advised to avoid Zumba and stand up paddle boarding until she is ambulating without any pain. She is still having some pain in anterolateral aspect of the ankle occasionally. She does still have some swelling if she is on her feet for an extended period of time. She is not  currently wrapping her ankle or using any compression stockings. Initially having some pain near the base of her fifth metatarsal to palpation but now only occurs with passive or active L ankle inversion; 03/07/20: L shoulder limited range of motion since July 2021. Pt reports that R shoulder has started to be limited over the last couple months. No trauma or history of shoulder surgery. Pt denies any prior issue with frozen shoulder or pain. Pt is not a diabetic and her last A1c was not concerning for impaired glucose tolerance. She does have a history of thyroid disease. Pt has been performing wall walks for flexion and abduction to help preserve range of motion since symptoms started.    Limitations Walking    Diagnostic tests See history    Patient Stated Goals Decrease pain and return to full function    Currently in Pain? No/denies             TREATMENT   Manual Therapy  Pt completed FOTO for ankle (95) and shoulder (68) as well as LEFS (80/80) Shoulder AROM R/L: flexion: 174/168, abduction: 162/133, ER: 90/77; Interim history and goal update; Bilateral shoulder anterior to posterioinferior mobilizations at available end range flexion, grade III, 20s/bout x 3 bouts each; Bilateral shoulder distraction mobilizations progressing through PROM abduction x multiple bouts on each side; L shoulder A/P mobilizations at 90 flexion pushing through long axis of humerus, grade III,  20s/bout x 2 bouts L shoulder A/P mobilization at 90 abduction and available end range ER, grade III, 20s/bout x 3 bouts, notable improvement in external rotation following mobilizations; ;   Ther-ex  Single leg balance on Airex pad without UE support, equal stability bilaterally for 30s; Single leg heel raises x 20 BLE, similar strength bilaterally; Tandem balance on 1/2 foam roller alternating forward LE without UE support x 30s each; Updated HEP to include wall angels and resisted "W's";   Trigger Point  Dry Needling (TDN), unbilled Education performed with patient regarding potential benefit of TDN. Previously reviewed precautions and risks with patient.  Clean technique utilized cleaning skin with alcohol prior to each placement and therapist donning gloves. Pt provided verbal consent to treatment. TDN performed to L upper trap with 1, 0.30 x 50 single needle placement with local twitch response (LTR). Pistoning technique utilized.   Pt educated throughout session about proper posture and technique with exercises. Improved exercise technique, movement at target joints, use of target muscles after min to mod verbal, visual, tactile cues.    Updated outcome measures with patient today. Her LEFS improved to 80/80 and her FOTO score for her ankle improved to 95. She reports infrequent pain in L anterolateral ankle. Ankle is strong in all directions and she demonstrates good dynamic stability. She demonstrates significant improvement in L shoulder flexion and abduction range of motion and good maintenance of R shoulder range of motion. Denies any new onset weakness in either shoulder. Continued with glenohumeral mobilizations bilaterally today and also incorporated trigger point dry needling for left shoulder.  We will continue to progress into additional strengthening at follow-up sessions.    Discharge patient today from care regarding for her left ankle. Patient encouraged to continue HEP and added wall angels and bilateral scapular retraction/external rotation strengthening today.  Pt will benefit from PT services to address deficits in shoulder range of motion, strength, and pain in order to return to full function at home and work without pain.                         PT Education - 04/13/20 1406    Education Details Plan of care, HEP, discharge ankle    Person(s) Educated Patient    Methods Explanation    Comprehension Verbalized understanding            PT Short  Term Goals - 04/13/20 0807      PT SHORT TERM GOAL #1   Title Pt will be independent with HEP in order to decrease ankle pain and increase pain-free strength/stability in order to improve pain-free function at home and work.    Time 4    Period Weeks    Status Achieved    Target Date --      PT SHORT TERM GOAL #2   Title Pt will be independent with HEP in order to decrease shoulder pain and increase pain-free strength and range of motion in order to improve pain-free function at home and work.    Time 4    Period Weeks    Status On-going    Target Date 03/28/20             PT Long Term Goals - 04/13/20 0801      PT LONG TERM GOAL #1   Title Pt will decrease worst pain as reported on NPRS by at least 3 points in order to demonstrate clinically significant reduction in ankle/foot  pain.    Baseline 02/29/20: worst:8/10; 04/13/20: 2/10    Time 8    Period Weeks    Status Achieved      PT LONG TERM GOAL #2   Title Pt will improve FOTO score to at least 79 in order to demonstrate clinically significant improvement in her function related to her L ankle    Baseline 02/29/20: 71; 04/13/20: 95    Time 8    Period Weeks    Status Achieved      PT LONG TERM GOAL #3   Title Pt will increase LEFS by at least 9 points in order to demonstrate significant improvement in lower extremity function.    Baseline 02/29/20: to be completed at next session; 04/13/20: 80/80    Time 8    Period Weeks    Status Achieved      PT LONG TERM GOAL #4   Title Pt will decrease worst shoulder pain as reported on NPRS by at least 3 points in order to demonstrate clinically significant reduction in pain.    Baseline 03/07/20: L: worst 9/10, R: worst 4/10; 04/13/20: L: 4/10, R: 2/10;    Time 8    Period Weeks    Status Partially Met    Target Date 04/25/20      PT LONG TERM GOAL #5   Title Pt will improve L shoulder AROM flexion, abduction, and external rotation to within 5 degrees of R shoulder in order to  improve pain-free funciton at home and work especially overhead reaching    Baseline 03/07/20: AROM R/L: flexion: 175/160, abduction 160/115, ER: 90/75; 04/13/20: R/L: flexion: 174/168, abduction 162/133, ER: 90/77    Time 8    Period Weeks    Status Partially Met    Target Date 04/25/20      Additional Long Term Goals   Additional Long Term Goals Yes      PT LONG TERM GOAL #6   Title Pt will improve shoulder FOTO score to at least 71 in order to demonstrate significant improvement in shoulder function    Baseline 03/07/20: 59; 04/13/20: 68    Time 8    Period Weeks    Status Partially Met    Target Date 04/25/20                 Plan - 04/13/20 0801    Clinical Impression Statement Updated outcome measures with patient today. Her LEFS improved to 80/80 and her FOTO score for her ankle improved to 95. She reports infrequent pain in L anterolateral ankle. Ankle is strong in all directions and she demonstrates good dynamic stability. She demonstrates significant improvement in L shoulder flexion and abduction range of motion and good maintenance of R shoulder range of motion. Denies any new onset weakness in either shoulder. Continued with glenohumeral mobilizations bilaterally today and also incorporated trigger point dry needling for left shoulder.  We will continue to progress into additional strengthening at follow-up sessions.    Discharge patient today from care regarding for her left ankle. Patient encouraged to continue HEP and added wall angels and bilateral scapular retraction/external rotation strengthening today.  Pt will benefit from PT services to address deficits in shoulder range of motion, strength, and pain in order to return to full function at home and work without pain.    Personal Factors and Comorbidities Age;Comorbidity 1    Comorbidities thyroid disease    Examination-Activity Limitations Lift;Reach Overhead    Examination-Participation Restrictions  Occupation;Community Activity  Stability/Clinical Decision Making Stable/Uncomplicated    Rehab Potential Excellent    PT Frequency 2x / week    PT Duration 8 weeks    PT Treatment/Interventions ADLs/Self Care Home Management;Aquatic Therapy;Biofeedback;Canalith Repostioning;Cryotherapy;Electrical Stimulation;Iontophoresis 72m/ml Dexamethasone;Moist Heat;Traction;Ultrasound;DME Instruction;Gait training;Functional mobility training;Therapeutic activities;Therapeutic exercise;Balance training;Neuromuscular re-education;Cognitive remediation;Patient/family education;Manual techniques;Passive range of motion;Dry needling;Splinting;Taping;Vestibular;Joint Manipulations;Other (comment)   phonophoresis with 1% diclofenac gel   PT Next Visit Plan Continue with shoulder mobilizations, consider TDN, progress strengthening as needed    PT Home Exercise Plan Access Code: [email or web address](ankle); Access Code: [email or web address] (shoulder)    Consulted and Agree with Plan of Care Patient           Patient will benefit from skilled therapeutic intervention in order to improve the following deficits and impairments:  Pain,Hypomobility  Visit Diagnosis: Chronic left shoulder pain  Chronic right shoulder pain  Stiffness of left shoulder, not elsewhere classified     Problem List Patient Active Problem List   Diagnosis Date Noted  . GERD with esophagitis 09/16/2019  . Chest wall discomfort 11/29/2018  . Neck discomfort 11/29/2018  . Fibroid uterus 02/02/2018  . Hypothyroidism 12/27/2017   JPhillips GroutPT, DPT, GCS  Nafisah Runions 04/13/2020, 2:10 PM  [place] [place] [address]Phone: [phone]  Fax:  [phone] Name: SNoe PittsleyMRN: [id number] of Birth: [birth year]

## 2020-04-18 ENCOUNTER — Ambulatory Visit: Payer: No Typology Code available for payment source

## 2020-04-24 ENCOUNTER — Encounter: Payer: No Typology Code available for payment source | Admitting: Physical Therapy

## 2020-04-26 ENCOUNTER — Other Ambulatory Visit: Payer: Self-pay

## 2020-04-26 ENCOUNTER — Ambulatory Visit: Payer: No Typology Code available for payment source

## 2020-04-26 DIAGNOSIS — M25512 Pain in left shoulder: Secondary | ICD-10-CM | POA: Diagnosis not present

## 2020-04-26 DIAGNOSIS — G8929 Other chronic pain: Secondary | ICD-10-CM

## 2020-04-26 DIAGNOSIS — M25612 Stiffness of left shoulder, not elsewhere classified: Secondary | ICD-10-CM

## 2020-04-26 NOTE — Therapy (Signed)
[place] [facility] [address] [place], [place], [address] Phone: [phone]   Fax:  [phone]  Physical Therapy Treatment/Recertification  Patient Details  Name: Adrienne Joyce MRN: [id number] Date of Birth: [birth year] Referring Provider (PT): Marylee Floras   Encounter Date: 04/26/2020   PT End of Session - 04/26/20 1103    Visit Number 6    Number of Visits 21    Date for PT Re-Evaluation 05/24/20    Authorization Type eval: 02/29/20 (ankle), 03/07/20 (shoulder)    PT Start Time 1100    PT Stop Time 1145    PT Time Calculation (min) 45 min    Activity Tolerance Patient tolerated treatment well    Behavior During Therapy [facility] for tasks assessed/performed           Past Medical History:  Diagnosis Date  . Fibroid   . GERD (gastroesophageal reflux disease)   . Thyroid disease     Past Surgical History:  Procedure Laterality Date  . HYSTEROSCOPY  2015  . NASAL POLYP EXCISION  2012   with septoplasty    There were no vitals filed for this visit.   Subjective Assessment - 04/26/20 1102    Subjective Pt reports that she is doing well today. Shoulders are doing well today without any resting pain. She reports some soreness after initiating her scapular retraction/ER strengthening as part of her HEP. No specific questions or concerns at this time.    Pertinent History L ankle inversion sprain 02/09/20 after falling in a hole. Initial radiographs were negative for fracture. She was put in walking boot for 5 weeks. Followed-up with podiatry and he performed repeat radiographs with no signs of fracture. She was discharged from his care with instructions to follow-up as needed. Advised to avoid Zumba and stand up paddle boarding until she is ambulating without any pain. She is still having some pain in anterolateral aspect of the ankle occasionally. She does still have some swelling if she is on her feet for an extended period of  time. She is not currently wrapping her ankle or using any compression stockings. Initially having some pain near the base of her fifth metatarsal to palpation but now only occurs with passive or active L ankle inversion; 03/07/20: L shoulder limited range of motion since July 2021. Pt reports that R shoulder has started to be limited over the last couple months. No trauma or history of shoulder surgery. Pt denies any prior issue with frozen shoulder or pain. Pt is not a diabetic and her last A1c was not concerning for impaired glucose tolerance. She does have a history of thyroid disease. Pt has been performing wall walks for flexion and abduction to help preserve range of motion since symptoms started.    Limitations Walking    Diagnostic tests See history    Patient Stated Goals Decrease pain and return to full function    Currently in Pain? No/denies             TREATMENT   Manual Therapy Bilateral shoulder anterior to posterioinferior mobilizations at available end range flexion, grade III, 20s/bout x 3 bouts each; Bilateral shoulder distraction mobilizations progressing through PROM abduction x multiple bouts on each side; Bilateral shoulder A/P mobilizations at 90 flexion pushing through long axis of humerus, grade III, 20s/bout x 2 bouts Bilateral shoulder A/P mobilization at 90 abduction and available end range ER, grade II, 20s/bout x 2 bouts each, pt reports notable stretch in  L distal pec on L side; Prone T1-T7 CPA mobilizations, grade III, 20s/bout x 2 bouts/level;   Ther-ex  UBE x 4 minutes (2 minutes forward/2 minutes backwards) for warm-up during history (3 minutes unbilled); Nautilus lat pull down 50# 2 x 10; Nautilus rows 40# 2 x 10; Nautilus chest press 40# with serratus protraction 2 x 10; Nautilus 30# shoulder ER 2 x 10 BUE;   Pt educated throughout session about proper posture and technique with exercises. Improved exercise technique, movement at target  joints, use of target muscles after min to mod verbal, visual, tactile cues.    Updated outcome measures with patient on 04/13/20. Her LEFS had improved to 80/80 and her FOTO score for her ankle improved to 95. She reports infrequent pain in L anterolateral ankle. Ankle is strong in all directions and she demonstrates good dynamic stability. She demonstrates significant improvement in L shoulder flexion and abduction range of motion and good maintenance of R shoulder range of motion. Denies any new onset weakness in either shoulder. Continued with glenohumeral mobilizations bilaterally today and also incorporated thoracic mobilizations.  Pt discharged from care with respect to her ankle sprain on 04/13/20. We will update her shoulder goals again at next session and if she has met her goals will consider discharge from therapy with respect to her shoulders. Will recertify pt for an additional 4 weeks to cover today's visit and likely only one additional visit. Pt will benefit from PT services to address deficits in shoulder range of motion, strength, and pain in order to return to full function at home and work without pain.                           PT Short Term Goals - 04/13/20 0807      PT SHORT TERM GOAL #1   Title Pt will be independent with HEP in order to decrease ankle pain and increase pain-free strength/stability in order to improve pain-free function at home and work.    Time 4    Period Weeks    Status Achieved    Target Date --      PT SHORT TERM GOAL #2   Title Pt will be independent with HEP in order to decrease shoulder pain and increase pain-free strength and range of motion in order to improve pain-free function at home and work.    Time 4    Period Weeks    Status On-going    Target Date 03/28/20             PT Long Term Goals - 04/13/20 0801      PT LONG TERM GOAL #1   Title Pt will decrease worst pain as reported on NPRS by at least 3 points in  order to demonstrate clinically significant reduction in ankle/foot pain.    Baseline 02/29/20: worst:8/10; 04/13/20: 2/10    Time 8    Period Weeks    Status Achieved      PT LONG TERM GOAL #2   Title Pt will improve FOTO score to at least 79 in order to demonstrate clinically significant improvement in her function related to her L ankle    Baseline 02/29/20: 71; 04/13/20: 95    Time 8    Period Weeks    Status Achieved      PT LONG TERM GOAL #3   Title Pt will increase LEFS by at least 9 points in order to demonstrate significant  improvement in lower extremity function.    Baseline 02/29/20: to be completed at next session; 04/13/20: 80/80    Time 8    Period Weeks    Status Achieved      PT LONG TERM GOAL #4   Title Pt will decrease worst shoulder pain as reported on NPRS by at least 3 points in order to demonstrate clinically significant reduction in pain.    Baseline 03/07/20: L: worst 9/10, R: worst 4/10; 04/13/20: L: 4/10, R: 2/10;    Time 8    Period Weeks    Status Partially Met    Target Date 04/25/20      PT LONG TERM GOAL #5   Title Pt will improve L shoulder AROM flexion, abduction, and external rotation to within 5 degrees of R shoulder in order to improve pain-free funciton at home and work especially overhead reaching    Baseline 03/07/20: AROM R/L: flexion: 175/160, abduction 160/115, ER: 90/75; 04/13/20: R/L: flexion: 174/168, abduction 162/133, ER: 90/77    Time 8    Period Weeks    Status Partially Met    Target Date 04/25/20      Additional Long Term Goals   Additional Long Term Goals Yes      PT LONG TERM GOAL #6   Title Pt will improve shoulder FOTO score to at least 71 in order to demonstrate significant improvement in shoulder function    Baseline 03/07/20: 59; 04/13/20: 68    Time 8    Period Weeks    Status Partially Met    Target Date 04/25/20                 Plan - 04/26/20 1103    Clinical Impression Statement Updated outcome measures with  patient on 04/13/20. Her LEFS had improved to 80/80 and her FOTO score for her ankle improved to 95. She reports infrequent pain in L anterolateral ankle. Ankle is strong in all directions and she demonstrates good dynamic stability. She demonstrates significant improvement in L shoulder flexion and abduction range of motion and good maintenance of R shoulder range of motion. Denies any new onset weakness in either shoulder. Continued with glenohumeral mobilizations bilaterally today and also incorporated thoracic mobilizations.  Pt discharged from care with respect to her ankle sprain on 04/13/20. We will update her shoulder goals again at next session and if she has met her goals will consider discharge from therapy with respect to her shoulders. Will recertify pt for an additional 4 weeks to cover today's visit and likely only one additional visit.  Pt will benefit from PT services to address deficits in shoulder range of motion, strength, and pain in order to return to full function at home and work without pain.    Personal Factors and Comorbidities Age;Comorbidity 1    Comorbidities thyroid disease    Examination-Activity Limitations Lift;Reach Overhead    Examination-Participation Restrictions Occupation;Community Activity    Stability/Clinical Decision Making Stable/Uncomplicated    Rehab Potential Excellent    PT Frequency 1x / week    PT Duration 4 weeks    PT Treatment/Interventions ADLs/Self Care Home Management;Aquatic Therapy;Biofeedback;Canalith Repostioning;Cryotherapy;Electrical Stimulation;Iontophoresis 37m/ml Dexamethasone;Moist Heat;Traction;Ultrasound;DME Instruction;Gait training;Functional mobility training;Therapeutic activities;Therapeutic exercise;Balance training;Neuromuscular re-education;Cognitive remediation;Patient/family education;Manual techniques;Passive range of motion;Dry needling;Splinting;Taping;Vestibular;Joint Manipulations;Other (comment)   phonophoresis with 1%  diclofenac gel   PT Next Visit Plan Update outcome measures and goals, discharge if goals have been met.    PT Home Exercise Plan Access Code: [email or web address](ankle); Access Code:  [email or web address] (shoulder)    Consulted and Agree with Plan of Care Patient           Patient will benefit from skilled therapeutic intervention in order to improve the following deficits and impairments:  Pain,Hypomobility  Visit Diagnosis: Chronic left shoulder pain - Plan: PT plan of care cert/re-cert  Chronic right shoulder pain - Plan: PT plan of care cert/re-cert  Stiffness of left shoulder, not elsewhere classified - Plan: PT plan of care cert/re-cert     Problem List Patient Active Problem List   Diagnosis Date Noted  . GERD with esophagitis 09/16/2019  . Chest wall discomfort 11/29/2018  . Neck discomfort 11/29/2018  . Fibroid uterus 02/02/2018  . Hypothyroidism 12/27/2017   Phillips Grout PT, DPT, GCS  Amelia Macken 04/26/2020, 3:09 PM  [place] [facility] [address] [place], [place], [address] Phone: [phone]   Fax:  [phone]  Name: Adrienne Joyce MRN: [id number] Date of Birth: [birth year]

## 2020-05-03 ENCOUNTER — Other Ambulatory Visit: Payer: Self-pay

## 2020-05-03 ENCOUNTER — Ambulatory Visit: Payer: No Typology Code available for payment source

## 2020-05-03 DIAGNOSIS — M25512 Pain in left shoulder: Secondary | ICD-10-CM | POA: Diagnosis not present

## 2020-05-03 DIAGNOSIS — G8929 Other chronic pain: Secondary | ICD-10-CM

## 2020-05-03 DIAGNOSIS — M25612 Stiffness of left shoulder, not elsewhere classified: Secondary | ICD-10-CM

## 2020-05-03 NOTE — Patient Instructions (Signed)
Access Code: [email or web address] URL: [email or web address] Date: 05/03/2020 Prepared by: Roxana Hires  Exercises Standing Overhead Shoulder External Rotation Stretch with Towel - 3 x daily - 7 x weekly - 3 reps - 45s hold Standing Shoulder External Rotation Stretch in Doorway - 3 x daily - 7 x weekly - 3 reps - 45s hold Shoulder ER Stretch in Abduction - 3 x daily - 7 x weekly - 3 reps - 45s hold Seated Shoulder External Rotation PROM on Table - 3 x daily - 7 x weekly - 3 reps - 45s hold Child's Pose Stretch - 1 x daily - 7 x weekly - 3 reps - 45s hold Seated Shoulder Flexion AAROM with Pulley in Front - 1 x daily - 7 x weekly - 2 sets - 10 reps - 5-10s hold Seated Shoulder Abduction AAROM with Pulley at Side - 1 x daily - 7 x weekly - 2 sets - 10 reps - 5-10s hold Shoulder W - External Rotation with Resistance - 1 x daily - 7 x weekly - 2 sets - 10 reps - 3s hold Standing Row with Anchored Resistance - 1 x daily - 7 x weekly - 2 sets - 10 reps - 3s hold Prone Single Arm Shoulder Y with Dumbbell - 1 x daily - 7 x weekly - 2 sets - 10 reps - 3s hold Prone Single Arm Shoulder Horizontal Abduction with Dumbbell - 1 x daily - 7 x weekly - 2 sets - 10 reps - 3s hold  Patient Education Frozen Shoulder

## 2020-05-03 NOTE — Therapy (Signed)
[facility] [address] [place], [place], [address] Phone: [phone]   Fax:  [phone]  Physical Therapy Treatment/Discharge  Patient Details  Name: Adrienne Joyce MRN: [id number] Date of Birth: 1965/09/30 Referring Provider (PT): Marylee Floras   Encounter Date: 05/03/2020   PT End of Session - 05/03/20 1427    Visit Number 7    Number of Visits 21    Date for PT Re-Evaluation 05/24/20    Authorization Type eval: 02/29/20 (ankle), 03/07/20 (shoulder)    PT Start Time 1430    PT Stop Time 1515    PT Time Calculation (min) 45 min    Activity Tolerance Patient tolerated treatment well    Behavior During Therapy [facility] for tasks assessed/performed           Past Medical History:  Diagnosis Date  . Fibroid   . GERD (gastroesophageal reflux disease)   . Thyroid disease     Past Surgical History:  Procedure Laterality Date  . HYSTEROSCOPY  2015  . NASAL POLYP EXCISION  2012   with septoplasty    There were no vitals filed for this visit.   Subjective Assessment - 05/03/20 1427    Subjective Pt reports that she is doing well today.  She denies any resting shoulder pain. She reports she has been able to tolerate longer Zumba sessions and has also been able to perform classes with a lot of overhead motions without issue. Overall she feels like her shoulders continue to improve and she is ready to discharge.    Pertinent History L ankle inversion sprain 02/09/20 after falling in a hole. Initial radiographs were negative for fracture. She was put in walking boot for 5 weeks. Followed-up with podiatry and he performed repeat radiographs with no signs of fracture. She was discharged from his care with instructions to follow-up as needed. Advised to avoid Zumba and stand up paddle boarding until she is ambulating without any pain. She is still having some pain in anterolateral aspect of the ankle occasionally. She does still  have some swelling if she is on her feet for an extended period of time. She is not currently wrapping her ankle or using any compression stockings. Initially having some pain near the base of her fifth metatarsal to palpation but now only occurs with passive or active L ankle inversion; 03/07/20: L shoulder limited range of motion since July 2021. Pt reports that R shoulder has started to be limited over the last couple months. No trauma or history of shoulder surgery. Pt denies any prior issue with frozen shoulder or pain. Pt is not a diabetic and her last A1c was not concerning for impaired glucose tolerance. She does have a history of thyroid disease. Pt has been performing wall walks for flexion and abduction to help preserve range of motion since symptoms started.    Limitations Walking    Diagnostic tests See history    Patient Stated Goals Decrease pain and return to full function    Currently in Pain? No/denies              TREATMENT   Manual Therapy Bilateral shoulder anterior to posterioinferior mobilizations at available end range flexion, grade III, 20s/bout x 3 bouts each; Bilateral shoulder distraction mobilizations progressing through PROM abduction x multiple bouts on each side; Bilateral shoulder A/P mobilizations at 90 flexion pushing through long axis of humerus, grade III, 20s/bout x 2 bouts each; Bilateral shoulder A/P mobilization at  90 abduction and available end range ER, grade II, 20s/bout x 2 bouts each;   Ther-ex  Updated outcome measures with patient: FOTO: 30 Worst pain: 2/10; 05/03/20: R/L: flexion: 176/167, abduction 161/149, ER: 90/79  UBE x 4 minutes (2 minutes forward/2 minutes backwards) for warm-up, unbilled; Supine serratus punch with manual resistance from therapist x 10 BUE; Sidelying shoulder ER with manual resistance from therapist 2 x 10 BUE; HEP review with progressions added;   Pt educated throughout session about proper posture and  technique with exercises. Improved exercise technique, movement at target joints, use of target muscles after min to mod verbal, visual, tactile cues.    Updated outcome measures and goals with patient today.  She demonstrates improvement in shoulder AROM to R/L: flexion: 176/167, abduction 161/149, ER: 90/79. Denies any new onset weakness in either shoulder. Worst shoulder pain has been a 2/10 and occurs only at end range. FOTO improved to 79 today. Continued with glenohumeral mobilizations and strengthening bilaterally during session. Updated HEP to include additional stretches and periscapular strengthening. Pt will be discharged on this date having returned to mostly full function with shoulders with minimal pain. Pt encouraged to return for additional therapy if she regresses or has return of her pain.                                 PT Education - 05/03/20 1538    Education Details Discharge, HEP progressions    Person(s) Educated Patient    Methods Handout;Explanation    Comprehension Verbalized understanding            PT Short Term Goals - 05/03/20 1538      PT SHORT TERM GOAL #1   Title Pt will be independent with HEP in order to decrease ankle pain and increase pain-free strength/stability in order to improve pain-free function at home and work.    Time 4    Period Weeks    Status Achieved      PT SHORT TERM GOAL #2   Title Pt will be independent with HEP in order to decrease shoulder pain and increase pain-free strength and range of motion in order to improve pain-free function at home and work.    Time 4    Period Weeks    Status Achieved             PT Long Term Goals - 05/03/20 1539      PT LONG TERM GOAL #1   Title Pt will decrease worst pain as reported on NPRS by at least 3 points in order to demonstrate clinically significant reduction in ankle/foot pain.    Baseline 02/29/20: worst:8/10; 04/13/20: 2/10    Time 8    Period Weeks     Status Achieved      PT LONG TERM GOAL #2   Title Pt will improve FOTO score to at least 79 in order to demonstrate clinically significant improvement in her function related to her L ankle    Baseline 02/29/20: 71; 04/13/20: 95    Time 8    Period Weeks    Status Achieved      PT LONG TERM GOAL #3   Title Pt will increase LEFS by at least 9 points in order to demonstrate significant improvement in lower extremity function.    Baseline 02/29/20: to be completed at next session; 04/13/20: 80/80    Time 8    Period  Weeks    Status Achieved      PT LONG TERM GOAL #4   Title Pt will decrease worst shoulder pain as reported on NPRS by at least 3 points in order to demonstrate clinically significant reduction in pain.    Baseline 03/07/20: L: worst 9/10, R: worst 4/10; 04/13/20: L: 4/10, R: 2/10; 05/03/20: 2/10 bilaterally;    Time 8    Period Weeks    Status Partially Met      PT LONG TERM GOAL #5   Title Pt will improve L shoulder AROM flexion, abduction, and external rotation to within 5 degrees of R shoulder in order to improve pain-free funciton at home and work especially overhead reaching    Baseline 03/07/20: AROM R/L: flexion: 175/160, abduction 160/115, ER: 90/75; 04/13/20: R/L: flexion: 174/168, abduction 162/133, ER: 90/77; 05/03/20: R/L: flexion: 176/167, abduction 161/149, ER: 90/79    Time 8    Period Weeks    Status Partially Met      Additional Long Term Goals   Additional Long Term Goals Yes      PT LONG TERM GOAL #6   Title Pt will improve shoulder FOTO score to at least 71 in order to demonstrate significant improvement in shoulder function    Baseline 03/07/20: 59; 04/13/20: 68; 05/03/20: 79    Time 8    Period Weeks    Status Achieved                 Plan - 05/03/20 1428    Clinical Impression Statement Updated outcome measures and goals with patient today.  She demonstrates improvement in shoulder AROM to R/L: flexion: 176/167, abduction 161/149, ER: 90/79.  Denies any new onset weakness in either shoulder. Worst shoulder pain has been a 2/10 and occurs only at end range. FOTO improved to 79 today. Continued with glenohumeral mobilizations and strengthening bilaterally during session. Updated HEP to include additional stretches and periscapular strengthening. Pt will be discharged on this date having returned to mostly full function with shoulders with minimal pain. Pt encouraged to return for additional therapy if she regresses or has return of her pain.    Personal Factors and Comorbidities Age;Comorbidity 1    Comorbidities thyroid disease    Examination-Activity Limitations Lift;Reach Overhead    Examination-Participation Restrictions Occupation;Community Activity    Stability/Clinical Decision Making Stable/Uncomplicated    Rehab Potential Excellent    PT Frequency 1x / week    PT Duration 4 weeks    PT Treatment/Interventions ADLs/Self Care Home Management;Aquatic Therapy;Biofeedback;Canalith Repostioning;Cryotherapy;Electrical Stimulation;Iontophoresis 55m/ml Dexamethasone;Moist Heat;Traction;Ultrasound;DME Instruction;Gait training;Functional mobility training;Therapeutic activities;Therapeutic exercise;Balance training;Neuromuscular re-education;Cognitive remediation;Patient/family education;Manual techniques;Passive range of motion;Dry needling;Splinting;Taping;Vestibular;Joint Manipulations;Other (comment)   phonophoresis with 1% diclofenac gel   PT Next Visit Plan Discharge    PT Home Exercise Plan Access Code: [email or web address](ankle); Access Code: [email or web address] (shoulder)    Consulted and Agree with Plan of Care Patient           Patient will benefit from skilled therapeutic intervention in order to improve the following deficits and impairments:  Pain,Hypomobility  Visit Diagnosis: Chronic left shoulder pain  Chronic right shoulder pain  Stiffness of left shoulder, not elsewhere classified     Problem List Patient Active Problem List    Diagnosis Date Noted  . GERD with esophagitis 09/16/2019  . Chest wall discomfort 11/29/2018  . Neck discomfort 11/29/2018  . Fibroid uterus 02/02/2018  . Hypothyroidism 12/27/2017   JLyndel SafeHuprich PT, DPT, GCS  Adrienne Joyce 05/04/2020, 8:09 AM  [facility] [address] [place], [place], [address] Phone: [phone]   Fax:  [phone]  Name: Adrienne Joyce MRN: [id number] Date of Birth: [birth year]

## 2020-05-04 ENCOUNTER — Ambulatory Visit: Payer: No Typology Code available for payment source

## 2020-05-15 ENCOUNTER — Encounter: Payer: Self-pay | Admitting: Internal Medicine

## 2020-05-16 ENCOUNTER — Telehealth: Payer: Self-pay

## 2020-05-16 NOTE — Telephone Encounter (Signed)
The pt sent a message to cancel her appt.

## 2020-05-24 ENCOUNTER — Ambulatory Visit: Payer: No Typology Code available for payment source | Admitting: Internal Medicine

## 2020-06-29 ENCOUNTER — Other Ambulatory Visit (HOSPITAL_BASED_OUTPATIENT_CLINIC_OR_DEPARTMENT_OTHER): Payer: Self-pay

## 2020-07-06 ENCOUNTER — Encounter: Payer: Self-pay | Admitting: Internal Medicine

## 2021-06-28 ENCOUNTER — Other Ambulatory Visit: Admit: 2021-06-28 | Payer: No Typology Code available for payment source

## 2021-07-19 ENCOUNTER — Ambulatory Visit
Admission: EM | Admit: 2021-07-19 | Discharge: 2021-07-19 | Disposition: A | Discharge status: Home / Self Care | Payer: 59 | Attending: Emergency Medicine | Admitting: Emergency Medicine

## 2021-07-19 DIAGNOSIS — W57XXXA Bitten or stung by nonvenomous insect and other nonvenomous arthropods, initial encounter: Secondary | ICD-10-CM

## 2021-07-19 DIAGNOSIS — Z91038 Other insect allergy status: Secondary | ICD-10-CM

## 2021-07-19 DIAGNOSIS — S50862A Insect bite (nonvenomous) of left forearm, initial encounter: Secondary | ICD-10-CM | POA: Diagnosis not present

## 2021-07-19 MED ORDER — PREDNISONE 10 MG PO TABS
60.0000 mg | ORAL_TABLET | Freq: Once | ORAL | Status: AC
Start: 2021-07-19 — End: 2021-07-19
  Administered 2021-07-19: 60 mg via ORAL

## 2021-07-19 MED ORDER — CEPHALEXIN 500 MG PO CAPS: 1000.0000 mg | ORAL_CAPSULE | Freq: Two times a day (BID) | ORAL | 0 refills | Status: AC

## 2021-07-19 MED ORDER — PREDNISONE 10 MG (21) PO TBPK: ORAL_TABLET | ORAL | 0 refills | Status: AC

## 2021-07-19 NOTE — ED Notes (Signed)
Pt triaged by provider  

## 2021-07-19 NOTE — Discharge Instructions (Addendum)
Cool compresses, Claritin or Zyrtec, ibuprofen can be helpful.  Finish the Keflex and prednisone, even if you feel better ?

## 2021-07-19 NOTE — ED Provider Notes (Signed)
HPI ? ?SUBJECTIVE: ? ?Adrienne Joyce is a 56 y.o. female who presents with intensely pruritic erythema, swelling on her left wrist after being bitten by an ant yesterday.  She states that she has a deep ache in her wrist.  States the erythema, swelling is spreading.  No nausea, vomiting, fevers.   No rash elsewhere.  She has tried icing it without improvement in her symptoms.  Symptoms are worse with hanging her hand in a dependent position.  No antipyretic in the past 6 hours.  This is happened twice with identical reactions-last time this happened, she required prednisone due to the intense allergic reaction that she had to the bite. No history of MRSA. ? ?Past Medical History:  ?Diagnosis Date  ? Fibroid   ? GERD (gastroesophageal reflux disease)   ? Thyroid disease   ? ? ?Past Surgical History:  ?Procedure Laterality Date  ? HYSTEROSCOPY  2015  ? NASAL POLYP EXCISION  2012  ? with septoplasty  ? ? ?Family History  ?Problem Relation Age of Onset  ? Colon cancer Mother 20  ?     carcinoid tumor  ? Acromegaly Mother   ? Diabetes Father   ? Hypertension Father   ? Alcohol abuse Father   ? Hyperlipidemia Father   ? ? ?Social History  ? ?Tobacco Use  ? Smoking status: Never  ? Smokeless tobacco: Never  ?Vaping Use  ? Vaping Use: Never used  ?Substance Use Topics  ? Alcohol use: Yes  ?  Comment: 1 a month  ? Drug use: Not Currently  ? ? ?No current facility-administered medications for this encounter. ? ?Current Outpatient Medications:  ?  cephALEXin (KEFLEX) 500 MG capsule, Take 2 capsules (1,000 mg total) by mouth 2 (two) times daily for 5 days., Disp: 20 capsule, Rfl: 0 ?  predniSONE (STERAPRED UNI-PAK 21 TAB) 10 MG (21) TBPK tablet, Dispense one 6 day pack. Take as directed with food., Disp: 21 tablet, Rfl: 0 ?  Magnesium 200 MG TABS, , Disp: , Rfl:  ?  Multiple Vitamin (MULTIVITAMIN) tablet, Take 1 tablet by mouth daily., Disp: , Rfl:  ?  phentermine 15 MG capsule, TAKE 1 CAPSULE BY MOUTH EVERY  MORNING., Disp: 30 capsule, Rfl: 0 ?  PROGESTERONE PO, Take by mouth., Disp: , Rfl:  ?  thyroid (ARMOUR THYROID) 60 MG tablet, Take 1 tablet (60 mg total) by mouth daily. (Patient taking differently: Take 60 mg by mouth daily. Wednesday Saturday and sunday), Disp: 90 tablet, Rfl: 1 ?  thyroid (ARMOUR) 30 MG tablet, TAKE 1 TABLET BY MOUTH ONCE DAILY BEFORE BREAKFAST. TAKE MONDAY THROUGH FRIDAY ONLY, SKIP THE WEEKENDS (Patient taking differently: Take 30 mg by mouth daily before breakfast. Monday Tuesday Thursday and Friday), Disp: 90 tablet, Rfl: 1 ? ?Allergies  ?Allergen Reactions  ? Morphine And Related Shortness Of Breath  ? Morpholine Salicylate Shortness Of Breath  ? Macrobid [Nitrofurantoin]   ? Latex Itching and Rash  ? ? ? ?ROS ? ?As noted in HPI.  ? ?Physical Exam ? ?BP 118/67   Pulse 90   Temp 98 ?F (36.7 ?C)   Resp 19   SpO2 98%  ? ?Constitutional: Well developed, well nourished, no acute distress ?Eyes:  EOMI, conjunctiva normal bilaterally ?HENT: Normocephalic, atraumatic,mucus membranes moist ?Respiratory: Normal inspiratory effort ?Cardiovascular: Normal rate ?GI: nondistended ?skin: 11 x 13 cm area of blanchable erythema, increased temperature distal left upper extremity, no tenderness over the wrist joint.  Positive swelling.  Positive raised  insect bite.  Marked area of erythema with a marker for reference. ? ? ? ? ?Musculoskeletal: no deformities ?Neurologic: Alert & oriented x 3, no focal neuro deficits ?Psychiatric: Speech and behavior appropriate ? ? ?ED Course ? ? ?Medications  ?predniSONE (DELTASONE) tablet 60 mg (60 mg Oral Given 07/19/21 1330)  ? ? ?No orders of the defined types were placed in this encounter. ? ? ?No results found for this or any previous visit (from the past 24 hour(s)). ?No results found. ? ?ED Clinical Impression ? ?1. Insect bite of left forearm, initial encounter   ?2. Allergic reaction to insect bite   ?  ? ?ED Assessment/Plan ? ?Patient with an intense allergic  reaction to an ant bite.  Concern for secondary cellulitis.  Giving 60 mg of prednisone here.  Home with 6-day steroid taper, Claritin or Zyrtec and Keflex for 5 days.  Follow-up with PCP or may return here if she gets worse. ? ?Discussed MDM, treatment plan, and plan for follow-up with patient. . patient agrees with plan.  ? ?Meds ordered this encounter  ?Medications  ? predniSONE (DELTASONE) tablet 60 mg  ? cephALEXin (KEFLEX) 500 MG capsule  ?  Sig: Take 2 capsules (1,000 mg total) by mouth 2 (two) times daily for 5 days.  ?  Dispense:  20 capsule  ?  Refill:  0  ? predniSONE (STERAPRED UNI-PAK 21 TAB) 10 MG (21) TBPK tablet  ?  Sig: Dispense one 6 day pack. Take as directed with food.  ?  Dispense:  21 tablet  ?  Refill:  0  ? ? ? ? ?*This clinic note was created using [employer]. Therefore, there may be occasional mistakes despite careful proofreading. ? ?? ? ?  ?Melynda Ripple, MD ?07/19/21 1337 ? ?
